# Patient Record
Sex: Female | Born: 1985 | Race: Black or African American | Hispanic: No | Marital: Single | Smoking: Former smoker
Health system: Southern US, Community
[De-identification: ages and names within clinical notes are randomized; demographics above are authoritative.]

## PROBLEM LIST (undated history)

## (undated) DIAGNOSIS — I10 Essential (primary) hypertension: Secondary | ICD-10-CM

## (undated) DIAGNOSIS — D219 Benign neoplasm of connective and other soft tissue, unspecified: Secondary | ICD-10-CM

## (undated) DIAGNOSIS — M549 Dorsalgia, unspecified: Secondary | ICD-10-CM

## (undated) HISTORY — DX: Essential (primary) hypertension: I10

## (undated) HISTORY — DX: Dorsalgia, unspecified: M54.9

## (undated) HISTORY — DX: Benign neoplasm of connective and other soft tissue, unspecified: D21.9

---

## 2008-06-28 ENCOUNTER — Emergency Department (HOSPITAL_COMMUNITY): Admission: EM | Admit: 2008-06-28 | Discharge: 2008-06-28 | Payer: Self-pay | Admitting: Emergency Medicine

## 2008-10-18 ENCOUNTER — Emergency Department (HOSPITAL_COMMUNITY): Admission: EM | Admit: 2008-10-18 | Discharge: 2008-10-18 | Payer: Self-pay | Admitting: Emergency Medicine

## 2009-05-23 ENCOUNTER — Emergency Department (HOSPITAL_COMMUNITY): Admission: EM | Admit: 2009-05-23 | Discharge: 2009-05-23 | Payer: Self-pay | Admitting: Emergency Medicine

## 2010-01-27 ENCOUNTER — Emergency Department (HOSPITAL_COMMUNITY): Admission: EM | Admit: 2010-01-27 | Discharge: 2010-01-27 | Payer: Self-pay | Admitting: Emergency Medicine

## 2010-06-29 LAB — WET PREP, GENITAL
Clue Cells Wet Prep HPF POC: NONE SEEN
Trich, Wet Prep: NONE SEEN
Yeast Wet Prep HPF POC: NONE SEEN

## 2010-06-29 LAB — URINALYSIS, ROUTINE W REFLEX MICROSCOPIC
Bilirubin Urine: NEGATIVE
Protein, ur: NEGATIVE mg/dL
Urobilinogen, UA: 1 mg/dL (ref 0.0–1.0)

## 2010-06-29 LAB — DIFFERENTIAL
Basophils Absolute: 0.1 10*3/uL (ref 0.0–0.1)
Basophils Relative: 2 % — ABNORMAL HIGH (ref 0–1)
Eosinophils Absolute: 0.1 10*3/uL (ref 0.0–0.7)
Eosinophils Relative: 2 % (ref 0–5)
Lymphocytes Relative: 27 % (ref 12–46)
Lymphs Abs: 1.8 10*3/uL (ref 0.7–4.0)
Monocytes Absolute: 0.7 10*3/uL (ref 0.1–1.0)
Monocytes Relative: 11 % (ref 3–12)
Neutro Abs: 4 10*3/uL (ref 1.7–7.7)
Neutrophils Relative %: 59 % (ref 43–77)

## 2010-06-29 LAB — GC/CHLAMYDIA PROBE AMP, GENITAL
Chlamydia, DNA Probe: NEGATIVE
GC Probe Amp, Genital: NEGATIVE

## 2010-06-29 LAB — URINE MICROSCOPIC-ADD ON

## 2010-06-29 LAB — POCT I-STAT, CHEM 8
Chloride: 105 mEq/L (ref 96–112)
Glucose, Bld: 105 mg/dL — ABNORMAL HIGH (ref 70–99)
HCT: 42 % (ref 36.0–46.0)
Potassium: 3.8 mEq/L (ref 3.5–5.1)
Sodium: 141 mEq/L (ref 135–145)

## 2010-06-29 LAB — CBC
Hemoglobin: 12.4 g/dL (ref 12.0–15.0)
MCHC: 32.8 g/dL (ref 30.0–36.0)
Platelets: 335 10*3/uL (ref 150–400)
RBC: 4.55 MIL/uL (ref 3.87–5.11)

## 2010-06-29 LAB — POCT PREGNANCY, URINE: Preg Test, Ur: NEGATIVE

## 2010-07-05 LAB — URINALYSIS, ROUTINE W REFLEX MICROSCOPIC
Bilirubin Urine: NEGATIVE
Glucose, UA: NEGATIVE mg/dL
Hgb urine dipstick: NEGATIVE
Ketones, ur: 15 mg/dL — AB
Protein, ur: NEGATIVE mg/dL
pH: 6 (ref 5.0–8.0)

## 2010-07-05 LAB — URINE MICROSCOPIC-ADD ON

## 2010-07-05 LAB — POCT PREGNANCY, URINE: Preg Test, Ur: NEGATIVE

## 2010-07-06 ENCOUNTER — Emergency Department (HOSPITAL_COMMUNITY): Payer: Self-pay

## 2010-07-06 ENCOUNTER — Emergency Department (HOSPITAL_COMMUNITY)
Admission: EM | Admit: 2010-07-06 | Discharge: 2010-07-06 | Disposition: A | Discharge status: Home / Self Care | Payer: Self-pay | Attending: Emergency Medicine | Admitting: Emergency Medicine

## 2010-07-06 DIAGNOSIS — R5381 Other malaise: Secondary | ICD-10-CM | POA: Insufficient documentation

## 2010-07-06 DIAGNOSIS — K089 Disorder of teeth and supporting structures, unspecified: Secondary | ICD-10-CM | POA: Insufficient documentation

## 2010-07-06 DIAGNOSIS — R5383 Other fatigue: Secondary | ICD-10-CM | POA: Insufficient documentation

## 2010-07-06 DIAGNOSIS — M542 Cervicalgia: Secondary | ICD-10-CM | POA: Insufficient documentation

## 2010-07-23 LAB — D-DIMER, QUANTITATIVE: D-Dimer, Quant: 0.49 ug/mL-FEU — ABNORMAL HIGH (ref 0.00–0.48)

## 2010-07-23 LAB — POCT I-STAT, CHEM 8
HCT: 44 % (ref 36.0–46.0)
Hemoglobin: 15 g/dL (ref 12.0–15.0)
Potassium: 3.7 mEq/L (ref 3.5–5.1)
Sodium: 139 mEq/L (ref 135–145)
TCO2: 24 mmol/L (ref 0–100)

## 2010-07-27 LAB — URINALYSIS, ROUTINE W REFLEX MICROSCOPIC
Glucose, UA: NEGATIVE mg/dL
Ketones, ur: NEGATIVE mg/dL
Protein, ur: NEGATIVE mg/dL
Urobilinogen, UA: 1 mg/dL (ref 0.0–1.0)

## 2010-07-27 LAB — WET PREP, GENITAL: Yeast Wet Prep HPF POC: NONE SEEN

## 2010-07-27 LAB — URINE MICROSCOPIC-ADD ON

## 2010-07-27 LAB — GC/CHLAMYDIA PROBE AMP, GENITAL: GC Probe Amp, Genital: NEGATIVE

## 2010-08-09 ENCOUNTER — Emergency Department (HOSPITAL_COMMUNITY)
Admission: EM | Admit: 2010-08-09 | Discharge: 2010-08-09 | Disposition: A | Discharge status: Home / Self Care | Payer: Self-pay | Attending: Emergency Medicine | Admitting: Emergency Medicine

## 2010-08-09 DIAGNOSIS — K089 Disorder of teeth and supporting structures, unspecified: Secondary | ICD-10-CM | POA: Insufficient documentation

## 2010-08-09 DIAGNOSIS — K047 Periapical abscess without sinus: Secondary | ICD-10-CM | POA: Insufficient documentation

## 2010-08-09 DIAGNOSIS — K029 Dental caries, unspecified: Secondary | ICD-10-CM | POA: Insufficient documentation

## 2011-01-02 ENCOUNTER — Emergency Department (HOSPITAL_COMMUNITY)
Admission: EM | Admit: 2011-01-02 | Discharge: 2011-01-02 | Disposition: A | Discharge status: Home / Self Care | Payer: Self-pay | Attending: Emergency Medicine | Admitting: Emergency Medicine

## 2011-01-02 DIAGNOSIS — N6459 Other signs and symptoms in breast: Secondary | ICD-10-CM | POA: Insufficient documentation

## 2011-01-02 DIAGNOSIS — J029 Acute pharyngitis, unspecified: Secondary | ICD-10-CM | POA: Insufficient documentation

## 2011-01-02 LAB — RAPID STREP SCREEN (MED CTR MEBANE ONLY): Streptococcus, Group A Screen (Direct): NEGATIVE

## 2011-01-05 ENCOUNTER — Emergency Department (HOSPITAL_COMMUNITY)
Admission: EM | Admit: 2011-01-05 | Discharge: 2011-01-05 | Disposition: A | Discharge status: Home / Self Care | Payer: Self-pay | Attending: Emergency Medicine | Admitting: Emergency Medicine

## 2011-01-05 ENCOUNTER — Emergency Department (HOSPITAL_COMMUNITY): Payer: Self-pay

## 2011-01-05 DIAGNOSIS — R059 Cough, unspecified: Secondary | ICD-10-CM | POA: Insufficient documentation

## 2011-01-05 DIAGNOSIS — B9789 Other viral agents as the cause of diseases classified elsewhere: Secondary | ICD-10-CM | POA: Insufficient documentation

## 2011-01-05 DIAGNOSIS — R5381 Other malaise: Secondary | ICD-10-CM | POA: Insufficient documentation

## 2011-01-05 DIAGNOSIS — R079 Chest pain, unspecified: Secondary | ICD-10-CM | POA: Insufficient documentation

## 2011-01-05 DIAGNOSIS — R5383 Other fatigue: Secondary | ICD-10-CM | POA: Insufficient documentation

## 2011-01-05 DIAGNOSIS — R05 Cough: Secondary | ICD-10-CM | POA: Insufficient documentation

## 2011-01-05 DIAGNOSIS — R091 Pleurisy: Secondary | ICD-10-CM | POA: Insufficient documentation

## 2011-01-05 DIAGNOSIS — N898 Other specified noninflammatory disorders of vagina: Secondary | ICD-10-CM | POA: Insufficient documentation

## 2011-01-05 LAB — WET PREP, GENITAL
Trich, Wet Prep: NONE SEEN
Yeast Wet Prep HPF POC: NONE SEEN

## 2011-01-31 ENCOUNTER — Encounter: Payer: Self-pay | Admitting: Family Medicine

## 2014-04-28 ENCOUNTER — Emergency Department: Payer: Self-pay | Admitting: Emergency Medicine

## 2014-04-28 LAB — COMPREHENSIVE METABOLIC PANEL
ALBUMIN: 3.7 g/dL (ref 3.4–5.0)
ANION GAP: 6 — AB (ref 7–16)
Alkaline Phosphatase: 84 U/L
BUN: 10 mg/dL (ref 7–18)
Bilirubin,Total: 0.3 mg/dL (ref 0.2–1.0)
Calcium, Total: 8.9 mg/dL (ref 8.5–10.1)
Chloride: 105 mmol/L (ref 98–107)
Co2: 27 mmol/L (ref 21–32)
Creatinine: 1.01 mg/dL (ref 0.60–1.30)
EGFR (African American): >60
EGFR (Non-African Amer.): >60
GLUCOSE: 88 mg/dL (ref 65–99)
OSMOLALITY: 274 (ref 275–301)
POTASSIUM: 3.4 mmol/L — AB (ref 3.5–5.1)
SGOT(AST): 18 U/L (ref 15–37)
SGPT (ALT): 24 U/L
SODIUM: 138 mmol/L (ref 136–145)
TOTAL PROTEIN: 8.1 g/dL (ref 6.4–8.2)

## 2014-04-28 LAB — CBC WITH DIFFERENTIAL/PLATELET
Basophil #: 0.1 10*3/uL (ref 0.0–0.1)
Basophil %: 0.6 %
EOS ABS: 0.1 10*3/uL (ref 0.0–0.7)
Eosinophil %: 1 %
HCT: 38.5 % (ref 35.0–47.0)
HGB: 12.4 g/dL (ref 12.0–16.0)
LYMPHS ABS: 2.6 10*3/uL (ref 1.0–3.6)
Lymphocyte %: 21.1 %
MCH: 26.4 pg (ref 26.0–34.0)
MCHC: 32.3 g/dL (ref 32.0–36.0)
MCV: 82 fL (ref 80–100)
MONO ABS: 1.4 x10 3/mm — AB (ref 0.2–0.9)
Monocyte %: 11.1 %
NEUTROS PCT: 66.2 %
Neutrophil #: 8 10*3/uL — ABNORMAL HIGH (ref 1.4–6.5)
PLATELETS: 336 10*3/uL (ref 150–440)
RBC: 4.72 10*6/uL (ref 3.80–5.20)
RDW: 14 % (ref 11.5–14.5)
WBC: 12.2 10*3/uL — ABNORMAL HIGH (ref 3.6–11.0)

## 2014-04-28 LAB — URINALYSIS, COMPLETE
BILIRUBIN, UR: NEGATIVE
Glucose,UR: NEGATIVE mg/dL (ref 0–75)
KETONE: NEGATIVE
Nitrite: POSITIVE
PH: 6 (ref 4.5–8.0)
PROTEIN: NEGATIVE
RBC,UR: 3 /HPF (ref 0–5)
SPECIFIC GRAVITY: 1.018 (ref 1.003–1.030)

## 2014-04-28 LAB — LIPASE, BLOOD: LIPASE: 89 U/L (ref 73–393)

## 2014-04-28 LAB — WET PREP, GENITAL

## 2014-04-29 LAB — GC/CHLAMYDIA PROBE AMP

## 2014-05-02 LAB — URINE CULTURE

## 2016-02-14 ENCOUNTER — Ambulatory Visit (INDEPENDENT_AMBULATORY_CARE_PROVIDER_SITE_OTHER): Payer: 59 | Admitting: Internal Medicine

## 2016-02-14 ENCOUNTER — Encounter: Payer: Self-pay | Admitting: Internal Medicine

## 2016-02-14 VITALS — BP 142/82 | HR 88 | Resp 16 | Ht 64.0 in | Wt 207.0 lb

## 2016-02-14 DIAGNOSIS — Z6835 Body mass index (BMI) 35.0-35.9, adult: Secondary | ICD-10-CM

## 2016-02-14 DIAGNOSIS — M62830 Muscle spasm of back: Secondary | ICD-10-CM | POA: Diagnosis not present

## 2016-02-14 DIAGNOSIS — N926 Irregular menstruation, unspecified: Secondary | ICD-10-CM | POA: Diagnosis not present

## 2016-02-14 DIAGNOSIS — I10 Essential (primary) hypertension: Secondary | ICD-10-CM | POA: Diagnosis not present

## 2016-02-14 MED ORDER — NAPROXEN 500 MG PO TABS: 500.0000 mg | ORAL_TABLET | Freq: Two times a day (BID) | ORAL | 0 refills | Status: DC

## 2016-02-14 MED ORDER — METHOCARBAMOL 500 MG PO TABS: 500.0000 mg | ORAL_TABLET | Freq: Three times a day (TID) | ORAL | 0 refills | Status: DC

## 2016-02-14 MED ORDER — IRBESARTAN 150 MG PO TABS: 150.0000 mg | ORAL_TABLET | Freq: Every day | ORAL | 5 refills | Status: DC

## 2016-02-14 NOTE — Progress Notes (Signed)
Date:  02/14/2016   Name:  Brandi Cole   DOB:  [birth year]   MRN:  [id number]   Chief Complaint: Establish Care and Menstrual Problem (Irregular and Heavy and dx with Fibroid by U/S 2015 [facility] ER never seen by OBGYN and followed [facility] Ion as a child but no PCP in years. ) Hypertension  This is a chronic problem. The current episode started more than 1 year ago. The problem is uncontrolled. Pertinent negatives include no chest pain, headaches, neck pain, palpitations or shortness of breath. There are no associated agents to hypertension. Past treatments include ACE inhibitors (cough on lisinopril and excessive urination of hct).  Back Pain  This is a recurrent problem. The problem has been waxing and waning since onset. The pain is present in the lumbar spine. The quality of the pain is described as aching and cramping. Pertinent negatives include no abdominal pain, chest pain, dysuria, fever, headaches, numbness, pelvic pain or weakness. She has tried muscle relaxant, analgesics, heat and home exercises for the symptoms. The treatment provided mild relief.   Uterine Fibroids - has had some irregular and heavy bleeding in the past seen by GYN.  She had an [place] and was placed on medication (Lysteda) for a year but stopped it in June when Rx ran out.  Overweight - needs BMI < 30.  She works for [employer] and has a form to be completed.  She has already started walking and cutting back on diet.  She thinks she has lost about 10-12 lbs.  Review of Systems  Constitutional: Negative for chills, fatigue and fever.  Respiratory: Negative for cough, chest tightness, shortness of breath and wheezing.   Cardiovascular: Negative for chest pain, palpitations and leg swelling.  Gastrointestinal: Negative for abdominal pain and blood in stool.  Genitourinary: Positive for menstrual problem. Negative for dysuria, genital sores and pelvic pain.  Musculoskeletal: Positive for back pain and myalgias. Negative  for gait problem, joint swelling, neck pain and neck stiffness.  Neurological: Negative for weakness, numbness and headaches.    Patient Active Problem List   Diagnosis Date Noted  . Irregular menses 02/14/2016    Prior to Admission medications   Medication Sig Start Date End Date Taking? Authorizing Provider  cyclobenzaprine (FLEXERIL) 10 MG tablet Take 10 mg by mouth 3 (three) times daily as needed for muscle spasms.   Yes Historical Provider, MD  lisinopril-hydrochlorothiazide (PRINZIDE,ZESTORETIC) 10-12.5 MG tablet Take by mouth.    Historical Provider, MD  tranexamic acid (LYSTEDA) 650 MG TABS tablet Take by mouth. 09/15/14   Historical Provider, MD    No Known Allergies  History reviewed. No pertinent surgical history.  Social History  Substance Use Topics  . Smoking status: Never Smoker  . Smokeless tobacco: Never Used  . Alcohol use Yes     Comment: weekend only mixed drinks      Medication list has been reviewed and updated.   Physical Exam  Constitutional: She is oriented to person, place, and time. She appears well-developed. No distress.  HENT:  Head: Normocephalic and atraumatic.  Cardiovascular: Normal rate, regular rhythm and normal heart sounds.   Pulmonary/Chest: Effort normal and breath sounds normal. No respiratory distress.  Musculoskeletal: Normal range of motion.       Lumbar back: She exhibits pain and spasm. She exhibits no bony tenderness and no deformity.       Arms: Severe spasm of muscles in the left para- spinous region lower thoracic and upper lumbar  Neurological: She is alert and oriented to person, place, and time. She has normal strength.  Reflex Scores:      Patellar reflexes are 2+ on the right side and 2+ on the left side. Skin: Skin is warm and dry. No rash noted.  Psychiatric: She has a normal mood and affect. Her behavior is normal. Thought content normal.  Nursing note and vitals reviewed.   BP (!) 142/82   Pulse 88   Resp 16    Ht 5\' 4"  (1.626 m)   Wt 207 lb (93.9 kg)   LMP 02/07/2016 (Approximate) Comment: Very heavy fibroids   SpO2 99%   BMI 35.53 kg/m   Assessment and Plan: 1. Irregular menses Somewhat stablized without medication at this time Follow up with GYN if needed  2. Muscle spasm of back Stop flexeril due to sedation; try robaxin for consistent dosing Continue heat Consider Chiropractic care after 1-2 weeks on the following regimen - methocarbamol (ROBAXIN) 500 MG tablet; Take 1 tablet (500 mg total) by mouth 3 (three) times daily.  Dispense: 90 tablet; Refill: 0 - naproxen (NAPROSYN) 500 MG tablet; Take 1 tablet (500 mg total) by mouth 2 (two) times daily with a meal.  Dispense: 60 tablet; Refill: 0  3. Essential hypertension Not controlled - - irbesartan (AVAPRO) 150 MG tablet; Take 1 tablet (150 mg total) by mouth daily.  Dispense: 30 tablet; Refill: 5  4. BMI 35.0-35.9,adult Note for [facility] given Continue exercise and dietary changes - goal weight loss 20 lbs over the next 6 months   Halina Maidens, MD [facility]  02/14/2016

## 2016-02-14 NOTE — Patient Instructions (Signed)
DASH Eating Plan  DASH stands for "Dietary Approaches to Stop Hypertension." The DASH eating plan is a healthy eating plan that has been shown to reduce high blood pressure (hypertension). Additional health benefits may include reducing the risk of type 2 diabetes mellitus, heart disease, and stroke. The DASH eating plan may also help with weight loss.  WHAT DO I NEED TO KNOW ABOUT THE DASH EATING PLAN?  For the DASH eating plan, you will follow these general guidelines:  · Choose foods with a percent daily value for sodium of less than 5% (as listed on the food label).  · Use salt-free seasonings or herbs instead of table salt or sea salt.  · Check with your health care provider or pharmacist before using salt substitutes.  · Eat lower-sodium products, often labeled as "lower sodium" or "no salt added."  · Eat fresh foods.  · Eat more vegetables, fruits, and low-fat dairy products.  · Choose whole grains. Look for the word "whole" as the first word in the ingredient list.  · Choose fish and skinless chicken or turkey more often than red meat. Limit fish, poultry, and meat to 6 oz (170 g) each day.  · Limit sweets, desserts, sugars, and sugary drinks.  · Choose heart-healthy fats.  · Limit cheese to 1 oz (28 g) per day.  · Eat more home-cooked food and less restaurant, buffet, and fast food.  · Limit fried foods.  · Cook foods using methods other than frying.  · Limit canned vegetables. If you do use them, rinse them well to decrease the sodium.  · When eating at a restaurant, ask that your food be prepared with less salt, or no salt if possible.  WHAT FOODS CAN I EAT?  Seek help from a dietitian for individual calorie needs.  Grains  Whole grain or whole wheat bread. Brown rice. Whole grain or whole wheat pasta. Quinoa, bulgur, and whole grain cereals. Low-sodium cereals. Corn or whole wheat flour tortillas. Whole grain cornbread. Whole grain crackers. Low-sodium crackers.  Vegetables  Fresh or frozen vegetables  (raw, steamed, roasted, or grilled). Low-sodium or reduced-sodium tomato and vegetable juices. Low-sodium or reduced-sodium tomato sauce and paste. Low-sodium or reduced-sodium canned vegetables.   Fruits  All fresh, canned (in natural juice), or frozen fruits.  Meat and Other Protein Products  Ground beef (85% or leaner), grass-fed beef, or beef trimmed of fat. Skinless chicken or turkey. Ground chicken or turkey. Pork trimmed of fat. All fish and seafood. Eggs. Dried beans, peas, or lentils. Unsalted nuts and seeds. Unsalted canned beans.  Dairy  Low-fat dairy products, such as skim or 1% milk, 2% or reduced-fat cheeses, low-fat ricotta or cottage cheese, or plain low-fat yogurt. Low-sodium or reduced-sodium cheeses.  Fats and Oils  Tub margarines without trans fats. Light or reduced-fat mayonnaise and salad dressings (reduced sodium). Avocado. Safflower, olive, or canola oils. Natural peanut or almond butter.  Other  Unsalted popcorn and pretzels.  The items listed above may not be a complete list of recommended foods or beverages. Contact your dietitian for more options.  WHAT FOODS ARE NOT RECOMMENDED?  Grains  White bread. White pasta. White rice. Refined cornbread. Bagels and croissants. Crackers that contain trans fat.  Vegetables  Creamed or fried vegetables. Vegetables in a cheese sauce. Regular canned vegetables. Regular canned tomato sauce and paste. Regular tomato and vegetable juices.  Fruits  Dried fruits. Canned fruit in light or heavy syrup. Fruit juice.  Meat and Other Protein   Products  Fatty cuts of meat. Ribs, chicken wings, bacon, sausage, bologna, salami, chitterlings, fatback, hot dogs, bratwurst, and packaged luncheon meats. Salted nuts and seeds. Canned beans with salt.  Dairy  Whole or 2% milk, cream, half-and-half, and cream cheese. Whole-fat or sweetened yogurt. Full-fat cheeses or blue cheese. Nondairy creamers and whipped toppings. Processed cheese, cheese spreads, or cheese  curds.  Condiments  Onion and garlic salt, seasoned salt, table salt, and sea salt. Canned and packaged gravies. Worcestershire sauce. Tartar sauce. Barbecue sauce. Teriyaki sauce. Soy sauce, including reduced sodium. Steak sauce. Fish sauce. Oyster sauce. Cocktail sauce. Horseradish. Ketchup and mustard. Meat flavorings and tenderizers. Bouillon cubes. Hot sauce. Tabasco sauce. Marinades. Taco seasonings. Relishes.  Fats and Oils  Butter, stick margarine, lard, shortening, ghee, and bacon fat. Coconut, palm kernel, or palm oils. Regular salad dressings.  Other  Pickles and olives. Salted popcorn and pretzels.  The items listed above may not be a complete list of foods and beverages to avoid. Contact your dietitian for more information.  WHERE CAN I FIND MORE INFORMATION?  National Heart, Lung, and Blood Institute: www.nhlbi.nih.gov/health/health-topics/topics/dash/     This information is not intended to replace advice given to you by your health care provider. Make sure you discuss any questions you have with your health care provider.     Document Released: 03/22/2011 Document Revised: 04/23/2014 Document Reviewed: 02/04/2013  Elsevier Interactive Patient Education ©2016 Elsevier Inc.

## 2016-04-20 ENCOUNTER — Ambulatory Visit: Payer: 59 | Admitting: Internal Medicine

## 2016-04-26 ENCOUNTER — Ambulatory Visit: Payer: 59 | Admitting: Internal Medicine

## 2016-12-28 ENCOUNTER — Ambulatory Visit: Payer: 59 | Admitting: Internal Medicine

## 2017-01-06 ENCOUNTER — Encounter: Payer: Self-pay | Admitting: Emergency Medicine

## 2017-01-06 ENCOUNTER — Emergency Department
Admission: EM | Admit: 2017-01-06 | Discharge: 2017-01-06 | Disposition: A | Discharge status: Home / Self Care | Payer: 59 | Attending: Emergency Medicine | Admitting: Emergency Medicine

## 2017-01-06 DIAGNOSIS — Z791 Long term (current) use of non-steroidal anti-inflammatories (NSAID): Secondary | ICD-10-CM | POA: Insufficient documentation

## 2017-01-06 DIAGNOSIS — K130 Diseases of lips: Secondary | ICD-10-CM

## 2017-01-06 DIAGNOSIS — R220 Localized swelling, mass and lump, head: Secondary | ICD-10-CM | POA: Insufficient documentation

## 2017-01-06 DIAGNOSIS — Z79899 Other long term (current) drug therapy: Secondary | ICD-10-CM | POA: Insufficient documentation

## 2017-01-06 DIAGNOSIS — I10 Essential (primary) hypertension: Secondary | ICD-10-CM | POA: Insufficient documentation

## 2017-01-06 DIAGNOSIS — Z8619 Personal history of other infectious and parasitic diseases: Secondary | ICD-10-CM | POA: Insufficient documentation

## 2017-01-06 DIAGNOSIS — R600 Localized edema: Secondary | ICD-10-CM | POA: Diagnosis present

## 2017-01-06 MED ORDER — AMOXICILLIN 500 MG PO CAPS: 500.0000 mg | ORAL_CAPSULE | Freq: Three times a day (TID) | ORAL | 0 refills | Status: DC

## 2017-01-06 MED ORDER — METHYLPREDNISOLONE SODIUM SUCC 125 MG IJ SOLR
125.0000 mg | Freq: Once | INTRAMUSCULAR | Status: AC
  Administered 2017-01-06: 125 mg via INTRAMUSCULAR
  Filled 2017-01-06: qty 2

## 2017-01-06 MED ORDER — ACYCLOVIR 200 MG PO CAPS: 200.0000 mg | ORAL_CAPSULE | Freq: Every day | ORAL | 0 refills | Status: AC

## 2017-01-06 MED ORDER — DIPHENHYDRAMINE HCL 25 MG PO CAPS: 25.0000 mg | ORAL_CAPSULE | ORAL | 0 refills | Status: DC | PRN

## 2017-01-06 NOTE — ED Notes (Signed)
See triage note  Presents with swelling to upper lip  States she noticed swelling to upper yesterday  Area became worse this am with some swelling to left side of face

## 2017-01-06 NOTE — ED Provider Notes (Signed)
[facility] Emergency Department Provider Note  ____________________________________________  Time seen: Approximately 2:10 PM  I have reviewed the triage vital signs and the nursing notes.   HISTORY  Chief Complaint Oral Swelling    HPI Brandi Cole is a 31 y.o. female that presents to the emergency department for evaluation of upper lip swelling for 1 day. Patient is primarily concerned that she is having an allergic reaction. She ate mushrooms yesterday, which she has not had a long time and things that could be it. She has a sore on her lip that she states is just from dry cracked lips. Sore is painful. She is concerned that maybe it is now infected. She has had cold sores before but they have not looked like this. She just returned from [place]. No fever, throat swelling, shortness of breath, chest pain, nausea, vomiting, abdominal pain.   Past Medical History:  Diagnosis Date  . Back pain   . Fibroids   . Hypertension     Patient Active Problem List   Diagnosis Date Noted  . Irregular menses 02/14/2016    History reviewed. No pertinent surgical history.  Prior to Admission medications   Medication Sig Start Date End Date Taking? Authorizing Provider  acyclovir (ZOVIRAX) 200 MG capsule Take 1 capsule (200 mg total) by mouth 5 (five) times daily. 01/06/17 01/16/17  Laban Emperor, PA-C  amoxicillin (AMOXIL) 500 MG capsule Take 1 capsule (500 mg total) by mouth 3 (three) times daily. 01/06/17   Laban Emperor, PA-C  diphenhydrAMINE (BENADRYL) 25 mg capsule Take 1 capsule (25 mg total) by mouth every 4 (four) hours as needed. 01/06/17 01/06/18  Laban Emperor, PA-C  irbesartan (AVAPRO) 150 MG tablet Take 1 tablet (150 mg total) by mouth daily. 02/14/16   Glean Hess, MD  methocarbamol (ROBAXIN) 500 MG tablet Take 1 tablet (500 mg total) by mouth 3 (three) times daily. 02/14/16   Glean Hess, MD  naproxen (NAPROSYN) 500 MG tablet Take 1 tablet  (500 mg total) by mouth 2 (two) times daily with a meal. 02/14/16   Glean Hess, MD    Allergies Ace inhibitors  Family History  Problem Relation Age of Onset  . Family history unknown: Yes    Social History Social History  Substance Use Topics  . Smoking status: Never Smoker  . Smokeless tobacco: Never Used  . Alcohol use Yes     Comment: weekend only mixed drinks      Review of Systems  Constitutional: No fever/chills Cardiovascular: No chest pain. Respiratory: No SOB. Gastrointestinal: No abdominal pain.  No nausea, no vomiting.  Musculoskeletal: Negative for musculoskeletal pain. Skin: Negative for lacerations, ecchymosis. Neurological: Negative for headaches   ____________________________________________   PHYSICAL EXAM:  VITAL SIGNS: ED Triage Vitals  Enc Vitals Group     BP 01/06/17 1257 (!) 164/100     Pulse Rate 01/06/17 1257 91     Resp 01/06/17 1257 18     Temp 01/06/17 1257 99.3 F (37.4 C)     Temp Source 01/06/17 1257 Oral     SpO2 01/06/17 1257 100 %     Weight 01/06/17 1258 216 lb (98 kg)     Height 01/06/17 1258 5\' 4"  (1.626 m)     Head Circumference --      Peak Flow --      Pain Score 01/06/17 1300 5     Pain Loc --      Pain Edu? --  Excl. in [place]? --      Constitutional: Alert and oriented. Well appearing and in no acute distress. Eyes: Conjunctivae are normal. PERRL. EOMI. Head: Atraumatic. ENT:      Ears:      Nose: No congestion/rhinnorhea.      Mouth/Throat: Mucous membranes are moist. Cluster of blisters with overlying yellow crusting and surrounding swelling to top left lip. Neck: No stridor.   Cardiovascular: Normal rate, regular rhythm.  Good peripheral circulation. Respiratory: Normal respiratory effort without tachypnea or retractions. Lungs CTAB. Good air entry to the bases with no decreased or absent breath sounds. Musculoskeletal: Full range of motion to all extremities. No gross deformities  appreciated. Neurologic:  Normal speech and language. No gross focal neurologic deficits are appreciated.  Skin:  Skin is warm, dry and intact. No rash noted.   ____________________________________________   LABS (all labs ordered are listed, but only abnormal results are displayed)  Labs Reviewed - No data to display ____________________________________________  EKG   ____________________________________________  RADIOLOGY  No results found.  ____________________________________________    PROCEDURES  Procedure(s) performed:    Procedures    Medications  methylPREDNISolone sodium succinate (SOLU-MEDROL) 125 mg/2 mL injection 125 mg (125 mg Intramuscular Given 01/06/17 1425)     ____________________________________________   INITIAL IMPRESSION / ASSESSMENT AND PLAN / ED COURSE  Pertinent labs & imaging results that were available during my care of the patient were reviewed by me and considered in my medical decision making (see chart for details).  Review of the [place] CSRS was performed in accordance of the [place] prior to dispensing any controlled drugs.   She presented to the emergency department for evaluation of upper lip swelling. Symptoms are consistent with a cold sore. Patient disagrees and is more concerned for allergic reaction or infection. I educated patient about cold sores and that she is at risk for transmitting. I agreed to also cover her for an allergic reaction or infection. Patient will be discharged home with prescriptions for Amoxicillin and Acyclovir. Patient is to follow up with PCP as directed. Patient is given ED precautions to return to the ED for any worsening or new symptoms.     ____________________________________________  FINAL CLINICAL IMPRESSION(S) / ED DIAGNOSES  Final diagnoses:  Swelling of upper lip  H/O cold sores  Sore of lip      NEW MEDICATIONS STARTED DURING THIS VISIT:  Discharge Medication List as of 01/06/2017   2:39 PM    START taking these medications   Details  acyclovir (ZOVIRAX) 200 MG capsule Take 1 capsule (200 mg total) by mouth 5 (five) times daily., Starting Sun 01/06/2017, Until Wed 01/16/2017, Print    amoxicillin (AMOXIL) 500 MG capsule Take 1 capsule (500 mg total) by mouth 3 (three) times daily., Starting Sun 01/06/2017, Print    diphenhydrAMINE (BENADRYL) 25 mg capsule Take 1 capsule (25 mg total) by mouth every 4 (four) hours as needed., Starting Sun 01/06/2017, Until Mon 01/06/2018, Print            This chart was dictated using voice recognition software/Dragon. Despite best efforts to proofread, errors can occur which can change the meaning. Any change was purely unintentional.    Laban Emperor, PA-C 01/06/17 1548    Burlene Arnt Gerda Diss, MD 01/07/17 2154

## 2017-01-06 NOTE — ED Triage Notes (Signed)
Small swollen area to upper lip. Started yesterday. Pt unsure if from mushrooms. Looks more like wound than allergic reaction. No fever. No dyspnea or dysphagia noted.

## 2017-01-11 ENCOUNTER — Ambulatory Visit
Admission: RE | Admit: 2017-01-11 | Discharge: 2017-01-11 | Disposition: A | Discharge status: Home / Self Care | Payer: 59 | Source: Ambulatory Visit | Attending: Internal Medicine | Admitting: Internal Medicine

## 2017-01-11 ENCOUNTER — Encounter: Payer: Self-pay | Admitting: Internal Medicine

## 2017-01-11 ENCOUNTER — Ambulatory Visit (INDEPENDENT_AMBULATORY_CARE_PROVIDER_SITE_OTHER): Payer: 59 | Admitting: Internal Medicine

## 2017-01-11 VITALS — BP 134/86 | HR 87 | Ht 64.0 in | Wt 218.0 lb

## 2017-01-11 DIAGNOSIS — Z6837 Body mass index (BMI) 37.0-37.9, adult: Secondary | ICD-10-CM | POA: Diagnosis not present

## 2017-01-11 DIAGNOSIS — M62830 Muscle spasm of back: Secondary | ICD-10-CM

## 2017-01-11 DIAGNOSIS — N926 Irregular menstruation, unspecified: Secondary | ICD-10-CM

## 2017-01-11 DIAGNOSIS — I10 Essential (primary) hypertension: Secondary | ICD-10-CM

## 2017-01-11 MED ORDER — METHOCARBAMOL 500 MG PO TABS: 500.0000 mg | ORAL_TABLET | Freq: Three times a day (TID) | ORAL | 2 refills | Status: DC

## 2017-01-11 MED ORDER — NAPROXEN 500 MG PO TABS: 500.0000 mg | ORAL_TABLET | Freq: Two times a day (BID) | ORAL | 2 refills | Status: DC

## 2017-01-11 MED ORDER — IRBESARTAN 150 MG PO TABS: 150.0000 mg | ORAL_TABLET | Freq: Every day | ORAL | 5 refills | Status: DC

## 2017-01-11 NOTE — Progress Notes (Signed)
Pt informed. Patient would like referral for PT since XRAY normal.

## 2017-01-11 NOTE — Progress Notes (Signed)
Date:  01/11/2017   Name:  Brandi Cole   DOB:  [birth year]   MRN:  [id number]   Chief Complaint: BMI (Form for work needs to be discuss and faxed in.); Back Pain (Pulled muscle in back in 2011 and then within the last year. Taking methocarbamol but no longer helping. 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 ); and Fibroids (Found out had fibroids in 2016. Wants to discuss meds for periods being heavier. )  Back Pain  This is a recurrent problem. The problem has been waxing and waning since onset. The pain is present in the thoracic spine. The pain is mild. The symptoms are aggravated by bending, standing and twisting. Associated symptoms include pelvic pain. Pertinent negatives include no abdominal pain, chest pain or fever. She has tried muscle relaxant for the symptoms. The treatment provided no relief.   Menstrual problems - heavy bleeding with cramping and clots lasting 5-7 days.  Seen by GYN and placed on medications in the past.  Obesity - works for [facility] and needs BMI exception. She wants to lose about 30 lbs.  Her plan is to go back to the gym and work harder.  She is changing her diet and cooking more at home.  Review of Systems  Constitutional: Negative for chills, fatigue and fever.  Respiratory: Negative for chest tightness, shortness of breath and wheezing.   Cardiovascular: Negative for chest pain, palpitations and leg swelling.  Gastrointestinal: Negative for abdominal pain.  Genitourinary: Positive for menstrual problem and pelvic pain.  Musculoskeletal: Positive for back pain.    Patient Active Problem List   Diagnosis Date Noted  . Irregular menses 02/14/2016    Prior to Admission medications   Medication Sig Start Date End Date Taking? Authorizing Provider  acyclovir (ZOVIRAX) 200 MG capsule Take 1 capsule (200 mg total) by mouth 5 (five) times daily. 01/06/17 01/16/17 Yes Laban Emperor, PA-C  amoxicillin (AMOXIL) 500  MG capsule Take 1 capsule (500 mg total) by mouth 3 (three) times daily. 01/06/17  Yes Laban Emperor, PA-C  irbesartan (AVAPRO) 150 MG tablet Take 1 tablet (150 mg total) by mouth daily. 02/14/16  Yes Glean Hess, MD  methocarbamol (ROBAXIN) 500 MG tablet Take 1 tablet (500 mg total) by mouth 3 (three) times daily. 02/14/16  Yes Glean Hess, MD  naproxen (NAPROSYN) 500 MG tablet Take 1 tablet (500 mg total) by mouth 2 (two) times daily with a meal. 02/14/16  Yes Glean Hess, MD    Allergies  Allergen Reactions  . Ace Inhibitors Cough    History reviewed. No pertinent surgical history.  Social History  Substance Use Topics  . Smoking status: Former Smoker    Quit date: 09/15/2015  . Smokeless tobacco: Never Used  . Alcohol use Yes     Comment: weekend only mixed drinks      Medication list has been reviewed and updated.  PHQ 2/9 Scores 01/11/2017  PHQ - 2 Score 0    Physical Exam  Constitutional: She is oriented to person, place, and time. She appears well-developed. No distress.  HENT:  Head: Normocephalic and atraumatic.  Neck: Normal range of motion. Neck supple.  Cardiovascular: Normal rate, regular rhythm and normal heart sounds.   Pulmonary/Chest: Effort normal and breath sounds normal. No respiratory distress. She has no wheezes.  Musculoskeletal: She exhibits no edema.       Thoracic back: She exhibits tenderness and  spasm.  Neurological: She is alert and oriented to person, place, and time.  Skin: Skin is warm and dry. No rash noted.  Psychiatric: She has a normal mood and affect. Her speech is normal and behavior is normal. Thought content normal.  Nursing note and vitals reviewed.   BP 134/86 (BP Location: Left Arm, Patient Position: Sitting, Cuff Size: Large)   Pulse 87   Ht 5\' 4"  (1.626 m)   Wt 218 lb (98.9 kg)   LMP 01/09/2017 (Exact Date)   SpO2 99%   BMI 37.42 kg/m   Assessment and Plan: 1. Irregular menses Recommend follow up with  GYN  2. Muscle spasm of back xrays to rule out mild scolosis May need to refer to PT - DG Thoracic Spine 2 View; Future - methocarbamol (ROBAXIN) 500 MG tablet; Take 1 tablet (500 mg total) by mouth 3 (three) times daily.  Dispense: 90 tablet; Refill: 2 - naproxen (NAPROSYN) 500 MG tablet; Take 1 tablet (500 mg total) by mouth 2 (two) times daily with a meal.  Dispense: 60 tablet; Refill: 2  3. BMI 37.0-37.9, adult Form exemption completed  4. Essential hypertension controlled - irbesartan (AVAPRO) 150 MG tablet; Take 1 tablet (150 mg total) by mouth daily.  Dispense: 30 tablet; Refill: 5   Meds ordered this encounter  Medications  . methocarbamol (ROBAXIN) 500 MG tablet    Sig: Take 1 tablet (500 mg total) by mouth 3 (three) times daily.    Dispense:  90 tablet    Refill:  2  . irbesartan (AVAPRO) 150 MG tablet    Sig: Take 1 tablet (150 mg total) by mouth daily.    Dispense:  30 tablet    Refill:  5  . naproxen (NAPROSYN) 500 MG tablet    Sig: Take 1 tablet (500 mg total) by mouth 2 (two) times daily with a meal.    Dispense:  60 tablet    Refill:  2    Partially dictated using Editor, commissioning. Any errors are unintentional.  Halina Maidens, MD [facility]  01/11/2017

## 2017-01-14 ENCOUNTER — Other Ambulatory Visit: Payer: Self-pay | Admitting: Internal Medicine

## 2017-01-14 DIAGNOSIS — M62830 Muscle spasm of back: Secondary | ICD-10-CM

## 2017-02-08 ENCOUNTER — Ambulatory Visit: Payer: 59 | Attending: Internal Medicine

## 2017-02-08 DIAGNOSIS — M545 Low back pain, unspecified: Secondary | ICD-10-CM

## 2017-02-08 DIAGNOSIS — G8929 Other chronic pain: Secondary | ICD-10-CM | POA: Diagnosis present

## 2017-02-08 DIAGNOSIS — M546 Pain in thoracic spine: Secondary | ICD-10-CM | POA: Diagnosis present

## 2017-02-08 NOTE — Therapy (Signed)
[facility] [address] [place], [address] Phone: [phone]   Fax:  [phone]  Physical Therapy Evaluation  Patient Details  Name: Brandi Cole MRN: [id number] Date of Birth: [birth year] Referring Provider: Halina Maidens  Encounter Date: 02/08/2017      PT End of Session - 02/08/17 1153    Visit Number 1   Number of Visits 17   Date for PT Re-Evaluation 04/05/17   Authorization Type no g codes   PT Start Time 0820   PT Stop Time 0915   PT Time Calculation (min) 55 min   Activity Tolerance Patient tolerated treatment well   Behavior During Therapy [facility] for tasks assessed/performed      Past Medical History:  Diagnosis Date  . Back pain   . Fibroids   . Hypertension     No past surgical history on file.  There were no vitals filed for this visit.       Subjective Assessment - 02/08/17 0837    Subjective Mid and low back pain   Pertinent History Pt referred for physical therapy for low and mid back pain. She describes low back pain as sharp and mid back pain as throbbing. 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 She reports that she "pulled a muscle" in her back in 08/11/2009 when she was reaching down for something and when she stood up she felt like her lower back "shattered." Pt reports that she waited for 3 days and when she went to the ER she states that the muscle had swollen significantly. She was prescribed a steroid, tramadol, and administered morphine. She was told that she "strained the muscle." Pain improved but never completely resolved. She states that she had a repeat injury in 08/12/2014. When she saw Aida Puffer she was prescribed methocarbamol and naproxen. Naproxen doesn't help with her pain. She feels like the methocarbamol isn't helping as much anymore. She feels like she is taking too much of it  when her pain is exacerbated even though she is only taking it as prescribed. Pt has a history of fibroids with associated pain in RLQ which does radiate to her back. She does notice that her back and abdominal pain coincide at times but but she does frequently have back pain without abdominal pain. She has an upcoming appointment with her OBGYN. Pt denies radiating pain into hip or LE's. Denies N/T. Back pain occasionally wakes her up at night. Denies fevers, chills, night sweats, unexplained weight gain/loss, no personal history of cancer. No history of pregnancy. She reports that when her back hurts bad she feels winded and will have some R or L sided chest pain. Pt reports no associated LUE symptoms or neck pain. No prior history of cardiac problems.        Limitations Sitting   How long can you sit comfortably? 1.5 hours   How long can you stand comfortably? 30-40 minutes   How long can you walk comfortably? No limitation   Diagnostic tests Plain film radiographs: per pt "all normal"   Patient Stated Goals "sit for 2-4 hours without having to change position"   Currently in Pain? Yes   Pain Score 4   Best: 4/10, Worst: 10/10   Pain Location Back   Pain Orientation Right;Left;Mid;Lower   Pain Descriptors / Indicators Throbbing;Sharp   Pain Type Chronic pain  Pain Radiating Towards None   Pain Onset More than a month ago   Pain Frequency Intermittent   Aggravating Factors  extended sitting, bending, standing and twisting (especially while holding her niece in her arms),   Pain Relieving Factors walking, stretching, methocarbamol, heat, massage, position of comfort (slouching),   Multiple Pain Sites No               [facility] PT Assessment - 02/08/17 0842      Assessment   Medical Diagnosis Muscle spasm of back   Referring Provider Halina Maidens   Onset Date/Surgical Date 02/08/10  Approximate   Hand Dominance Right   Next MD Visit None scheduled. F/U as needed   Prior Therapy  None     Precautions   Precautions None     Restrictions   Weight Bearing Restrictions No     Balance Screen   Has the patient fallen in the past 6 months No   Has the patient had a decrease in activity level because of a fear of falling?  Yes   Is the patient reluctant to leave their home because of a fear of falling?  No     Home Ec[employer] residence     Prior Function   Level of Independence Independent     Cognition   Overall Cognitive Status Within Functional Limits for tasks assessed     Observation/Other Assessments   Other Surveys  Other Surveys   Modified [place] 26%   Fear Avoidance Belief Questionnaire (FABQ)  PA: 22/30, Work: 20/66, Total: 44/96     Sensation   Additional Comments Denies N/T in bilateral LE     Posture/Postural Control   Posture Comments No gross abnormalities in sitting posture noted     ROM / Strength   AROM / PROM / Strength AROM;Strength     AROM   Overall AROM Comments guarded and painful lumbar AROM in all planes. L lumbar pain with R lateral flexion, R lumbar pain with L lateral flexion. HS length is 70 degrees bilaterally with reproduction of low back pain in addition to hamstring stretch. Painful CPA throughout thoracic and lumbar spine with considerable guarding noted. Unable to assess segmental mobility secondary to pain. Negative Scour, FADIR, FABER bilaterally. Positive L slump. Hip IR/ER PROM grossly symmetrical and within acceptable limits. Low back pain reproduced with single knee to chest stretch.      Strength   Overall Strength Comments 5/5 throughout bilateral UE/LE without focal weakness.      Palpation   Palpation comment pain to palpation along bilateral lumbar paraspinals, bilateral periscapular musculature. Generalized pattern of pain without specific focal pain.            Objective measurements completed on examination: See above findings.                  PT  Education - 02/08/17 1152    Education provided Yes   Education Details HEP, plan of care   Person(s) Educated Patient   Methods Explanation;Demonstration;Handout   Comprehension Verbalized understanding          PT Short Term Goals - 02/08/17 1218      PT SHORT TERM GOAL #1   Title Pt will be independent with HEP in order to improve strength and decrease pain to improve pain-free function at home and work.    Time 4   Period Weeks   Status New   Target Date 03/08/17  PT Long Term Goals - 02/08/17 1219      PT LONG TERM GOAL #1   Title Pt will decrease mODI scoreby at least 13 points in order demonstrate clinically significant reduction in pain/disability    Baseline 02/08/17: 26%   Time 8   Period Days   Status New   Target Date 04/05/17     PT LONG TERM GOAL #2   Title Pt will be able to sit for at least 3 hours prior to needing to stand and move in order to better perform her job tasks   Baseline 02/08/17: 1.5 hours   Time 8   Period Weeks   Status New   Target Date 04/05/17     PT LONG TERM GOAL #3   Title Pt will decrease worst pain as reported on NPRS by at least 2 points in order to demonstrate clinically significant reduction in pain.    Baseline 02/08/17: worst: 10/10   Time 8   Period Weeks   Status New   Target Date 04/05/17                Plan - 02/08/17 1153    Clinical Impression Statement Pt is a pleasant 31 yo female referred to physical therapy for muscle spasm of back. Pt reports history of chronic mid and low back pain since she "pulled a muscle" in her back in 2011 when she was reaching down to pick something up. She feels like the methocarbamol isn't helping as much anymore with her pain and doesn't want to take it long term. Pt does have a history of fibroids with associated pain in RLQ of abdomen which does radiate to her back. She does notice that her back and abdominal pain coincide at times but she does frequently have  back pain without abdominal pain. Encouraged pt to follow up with OB-GYN regarding her fibroids. Otherwise ROS is negative for red flags. PT examination reveals normal strength throughout all extremities without weakness. Pt denies numbness/tingling or radicular-type pain. She is very painful to palpation along bilateral lumbar paraspinals and bilateral periscapular musculature. Generalized pattern of pain without specific focal pain noted. She has guarded and painful lumbar AROM and overpressure in all planes. HS length is 70 degrees bilaterally with reproduction of low back pain in addition to hamstring stretch. Painful CPA throughout thoracic and lumbar spine with considerable guarding noted. Unable to assess segmental mobility secondary to pain. Negative Scour, FADIR, FABER bilaterally. Hips do not appears be contributing to he back pain. Positive L slump but not reliable in setting of patient's highly irritable pain. Her modified ODI score is 26%. 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 She also reports that pain can be a stressor in addition to other stresses in her life. PHQ-9 is negative for depression screening but does indicate some sleeping difficulty and low energy. She reports history of anxiety but states she has not discussed this with Dr. Army Melia. She is fearful of movement with a fear avoidance score for physical activity of 22/30. Education today provided benefit of initial modalities for pain control with progression to graded exercise program to lower her pain. Stress reduction techniques would also be helpful in reducing her pain along with chronic pain education. She will benefit from skilled PT services to address deficits in back pain in order to improve pain-free function with work and  leisure activities.   History and Personal Factors relevant to plan of care: 2 personal  factors/comorbidities, 3 body systems/activity limitations/participation restrictions     Clinical Presentation Unstable   Clinical Presentation due to: highly variable pain, not improving since onset and intermittent in nature   Clinical Decision Making Moderate   Rehab Potential Good   PT Frequency 2x / week   PT Duration 8 weeks   PT Treatment/Interventions Aquatic Therapy;Cryotherapy;Electrical Stimulation;Iontophoresis 4mg /ml Dexamethasone;Moist Heat;Traction;Ultrasound;Gait training;Stair training;Functional mobility training;Therapeutic activities;Therapeutic exercise;Balance training;Neuromuscular re-education;Patient/family education;Manual techniques;Passive range of motion;Dry needling;Energy conservation   PT Next Visit Plan modalities as needed to interrupt pain cycle, progression to graded execise program   PT Home Exercise Plan Hooklying pelvic tilts, hooklying gentle lumbar AROM rotation, single knee to chest stretch      Patient will benefit from skilled therapeutic intervention in order to improve the following deficits and impairments:  Obesity, Pain  Visit Diagnosis: Chronic bilateral low back pain without sciatica - Plan: PT plan of care cert/re-cert  Pain in thoracic spine - Plan: PT plan of care cert/re-cert     Problem List Patient Active Problem List   Diagnosis Date Noted  . Irregular menses 02/14/2016   Phillips Grout PT, DPT   Sharmon Cheramie 02/08/2017, 12:24 PM  [facility] [address] [place], [address] Phone: [phone]   Fax:  [phone]  Name: Ramata Strothman MRN: [id number] Date of Birth: 29-Jul-1985

## 2017-02-13 ENCOUNTER — Ambulatory Visit: Payer: 59 | Admitting: Physical Therapy

## 2017-02-22 ENCOUNTER — Ambulatory Visit: Payer: 59 | Attending: Internal Medicine | Admitting: Physical Therapy

## 2017-02-22 DIAGNOSIS — M546 Pain in thoracic spine: Secondary | ICD-10-CM | POA: Diagnosis present

## 2017-02-22 DIAGNOSIS — M545 Low back pain: Secondary | ICD-10-CM | POA: Insufficient documentation

## 2017-02-22 DIAGNOSIS — G8929 Other chronic pain: Secondary | ICD-10-CM | POA: Diagnosis present

## 2017-02-22 NOTE — Patient Instructions (Signed)
Flexion - painful from mid through lumbar spine   Extenison- good ROM with mild discomfort   L rotation - pain with just lower back   R rotation - mid and lower back pain reproduction  SLR - positive on R, negative on L

## 2017-02-24 NOTE — Therapy (Signed)
[facility] [address], [place], [address] Phone: [phone]   Fax:  [phone]  Physical Therapy Treatment  Patient Details  Name: Brandi Cole MRN: [id number] Date of Birth: 12/12/1985 Referring Provider: Halina Maidens   Encounter Date: 02/22/2017  PT End of Session - 02/24/17 2317    Visit Number  2    Number of Visits  17    Date for PT Re-Evaluation  04/05/17    Authorization Type  no g codes    PT Start Time  1000    PT Stop Time  1045    PT Time Calculation (min)  45 min    Activity Tolerance  Patient tolerated treatment well    Behavior During Therapy  [facility] for tasks assessed/performed       Past Medical History:  Diagnosis Date  . Back pain   . Fibroids   . Hypertension     No past surgical history on file.  There were no vitals filed for this visit.  Subjective Assessment - 02/24/17 2316    Subjective  Patient reports she wasn't able to tolerate the manual therapy performed in previous session, and is generally frustrated with how long her back pain has been ongoing.     Pertinent History  Pt referred for physical therapy for low and mid back pain. She describes low back pain as sharp and mid back pain as throbbing. 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 She reports that she "pulled a muscle" in her back in 2011 when she was reaching down for something and when she stood up she felt like her lower back "shattered." Pt reports that she waited for 3 days and when she went to the ER she states that the muscle had swollen significantly. She was prescribed a steroid, tramadol, and administered morphine. She was told that she "strained the muscle." Pain improved but never completely resolved. She states that she had a repeat injury in 2016. When she saw Aida Puffer she was prescribed methocarbamol and  naproxen. Naproxen doesn't help with her pain. She feels like the methocarbamol isn't helping as much anymore. She feels like she is taking too much of it when her pain is exacerbated even though she is only taking it as prescribed. Pt has a history of fibroids with associated pain in RLQ which does radiate to her back. She does notice that her back and abdominal pain coincide at times but but she does frequently have back pain without abdominal pain. She has an upcoming appointment with her OBGYN. Pt denies radiating pain into hip or LE's. Denies N/T. Back pain occasionally wakes her up at night. Denies fevers, chills, night sweats, unexplained weight gain/loss, no personal history of cancer. No history of pregnancy. She reports that when her back hurts bad she feels winded and will have some R or L sided chest pain. Pt reports no associated LUE symptoms or neck pain. No prior history of cardiac problems.         Limitations  Sitting    How long can you sit comfortably?  1.5 hours    How long can you stand comfortably?  30-40 minutes    How long can you walk comfortably?  No limitation    Diagnostic tests  Plain film radiographs: per pt "all normal"    Patient Stated Goals  "sit for  2-4 hours without having to change position"    Currently in Pain?  Yes    Pain Score  4     Pain Location  Back    Pain Orientation  Right;Left;Mid;Lower    Pain Descriptors / Indicators  Aching;Sharp    Pain Type  Chronic pain    Pain Onset  More than a month ago    Pain Frequency  Constant       Flexion - painful from mid through lumbar spine   Extenison- good ROM with mild discomfort   L rotation - pain with just lower back   R rotation - mid and lower back pain reproduction  SLR - positive on R, negative on L   Hip IR/ER WNL bilaterally with discomfort bilaterally, FABER and FADIR were mildly uncomfortable as well bilaterally   Sidelying resisted clamshell was mildly uncomfortable in lumbar spine    Supine bridge test - reported feeling pain and discomfort through lumbar paraspinals with each attempt including with toes up   CPAs- generally reports feeling mild pain throughout lumbar and thoracic vertebra with no clear epicenter.   Performed soft tissue mobilization over lumbar paraspinals into thoracic paraspinals, patient reported significant reduction in pain symptons and feeling of tightness to complete session, including with flexion and extension in standing. DIscussed use of foam roller at home to continue to provide soft tissue work.                        PT Education - 02/24/17 2317    Education provided  Yes    Education Details  Will perform soft tissue mobilizations to assist with pain control before a more active approach    Person(s) Educated  Patient    Methods  Explanation    Comprehension  Verbalized understanding       PT Short Term Goals - 02/08/17 1218      PT SHORT TERM GOAL #1   Title  Pt will be independent with HEP in order to improve strength and decrease pain to improve pain-free function at home and work.     Time  4    Period  Weeks    Status  New    Target Date  03/08/17        PT Long Term Goals - 02/08/17 1219      PT LONG TERM GOAL #1   Title  Pt will decrease mODI scoreby at least 13 points in order demonstrate clinically significant reduction in pain/disability     Baseline  02/08/17: 26%    Time  8    Period  Days    Status  New    Target Date  04/05/17      PT LONG TERM GOAL #2   Title  Pt will be able to sit for at least 3 hours prior to needing to stand and move in order to better perform her job tasks    Baseline  02/08/17: 1.5 hours    Time  8    Period  Weeks    Status  New    Target Date  04/05/17      PT LONG TERM GOAL #3   Title  Pt will decrease worst pain as reported on NPRS by at least 2 points in order to demonstrate clinically significant reduction in pain.     Baseline  02/08/17: worst:  10/10    Time  8    Period  Weeks  Status  New    Target Date  04/05/17            Plan - 02/24/17 2317    Clinical Impression Statement  Patient appears to have a very positive response to soft tissue mobilization in lumbar paraspinals this date, reporting significant decrease in stiffness/pain she had initially been experiencing. Discussed a foam roller for prolonged soft tissue work/relief away from clinic. Her objective testing was not clear in differntiating lumbar spine vs hips as source of discomfort, will continue to monitor and treat with manual therapy as indicated.     Clinical Presentation  Stable    Clinical Decision Making  Moderate    Rehab Potential  Good    PT Frequency  2x / week    PT Duration  8 weeks    PT Treatment/Interventions  Aquatic Therapy;Cryotherapy;Electrical Stimulation;Iontophoresis 4mg /ml Dexamethasone;Moist Heat;Traction;Ultrasound;Gait training;Stair training;Functional mobility training;Therapeutic activities;Therapeutic exercise;Balance training;Neuromuscular re-education;Patient/family education;Manual techniques;Passive range of motion;Dry needling;Energy conservation    PT Next Visit Plan  modalities as needed to interrupt pain cycle, progression to graded execise program    PT Home Exercise Plan  Hooklying pelvic tilts, hooklying gentle lumbar AROM rotation, single knee to chest stretch       Patient will benefit from skilled therapeutic intervention in order to improve the following deficits and impairments:  Obesity, Pain  Visit Diagnosis: Pain in thoracic spine  Chronic bilateral low back pain without sciatica     Problem List Patient Active Problem List   Diagnosis Date Noted  . Irregular menses 02/14/2016    Royce Macadamia 02/24/2017, 11:21 PM  [facility] [address] [place], [address] Phone: [phone]   Fax:  [phone]  Name: Brandi Cole MRN: [id number] Date of Birth: [birth year]

## 2017-03-01 ENCOUNTER — Ambulatory Visit: Payer: 59 | Admitting: Physical Therapy

## 2017-03-14 ENCOUNTER — Encounter: Payer: Self-pay | Admitting: Internal Medicine

## 2017-03-15 ENCOUNTER — Ambulatory Visit: Payer: 59 | Admitting: Physical Therapy

## 2017-03-15 DIAGNOSIS — M546 Pain in thoracic spine: Secondary | ICD-10-CM

## 2017-03-15 DIAGNOSIS — G8929 Other chronic pain: Secondary | ICD-10-CM

## 2017-03-15 DIAGNOSIS — M545 Low back pain, unspecified: Secondary | ICD-10-CM

## 2017-03-15 NOTE — Patient Instructions (Signed)
CPAs at T10, UPAs T8-10 on L side x 4 bouts x 30" per bout grade I which she felt some discomfort at L5 on R side, performed grade I mobilizations at L5 for 2 bouts x 30" per bout (still uncomfortable) {Performed quad stretching x 15" for 8 bouts x 2 sets bilaterally which significantly relieved her symptoms)   Squats -- pain in L side of flank, did 1/2 kneeling hip flexor stretching on LLE   Squats with 10# x 15 repetitions, with 20# x 15, 40# x12  Lunges x 6 per side with cuing for appropriate technique   Leg Press 105# x 15, 115# x 15, 135# x 15   Provided the above as part of HEP, along with SLDL,

## 2017-03-15 NOTE — Therapy (Signed)
[facility] Jul 15, [address] [place], [address] Phone: [phone]   Fax:  [phone]  Physical Therapy Treatment  Patient Details  Name: Shakala Marlatt MRN: [id number] Date of Birth: 1985-06-08 Referring Provider: Halina Maidens   Encounter Date: 03/15/2017  PT End of Session - 03/15/17 1103    Visit Number  3    Number of Visits  17    Date for PT Re-Evaluation  04/05/17    Authorization Type  no g codes    PT Start Time  07/16/06    PT Stop Time  1058    PT Time Calculation (min)  50 min    Activity Tolerance  Patient tolerated treatment well    Behavior During Therapy  [facility] for tasks assessed/performed       Past Medical History:  Diagnosis Date  . Back pain   . Fibroids   . Hypertension     No past surgical history on file.  There were no vitals filed for this visit.  Subjective Assessment - 03/15/17 1036    Subjective  Patient reports her back has been feeling much better most days, reports she still gets bouts of pain every now and again, today is one of those days. She would like to get back into an exercise program if she is able.     Pertinent History  Pt referred for physical therapy for low and mid back pain. She describes low back pain as sharp and mid back pain as throbbing. 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 She reports that she "pulled a muscle" in her back in 15-Jul-2009 when she was reaching down for something and when she stood up she felt like her lower back "shattered." Pt reports that she waited for 3 days and when she went to the ER she states that the muscle had swollen significantly. She was prescribed a steroid, tramadol, and administered morphine. She was told that she "strained the muscle." Pain improved but never completely resolved. She states that she had a repeat injury in 16-Jul-2014. When she saw  Aida Puffer she was prescribed methocarbamol and naproxen. Naproxen doesn't help with her pain. She feels like the methocarbamol isn't helping as much anymore. She feels like she is taking too much of it when her pain is exacerbated even though she is only taking it as prescribed. Pt has a history of fibroids with associated pain in RLQ which does radiate to her back. She does notice that her back and abdominal pain coincide at times but but she does frequently have back pain without abdominal pain. She has an upcoming appointment with her OBGYN. Pt denies radiating pain into hip or LE's. Denies N/T. Back pain occasionally wakes her up at night. Denies fevers, chills, night sweats, unexplained weight gain/loss, no personal history of cancer. No history of pregnancy. She reports that when her back hurts bad she feels winded and will have some R or L sided chest pain. Pt reports no associated LUE symptoms or neck pain. No prior history of cardiac problems.         Limitations  Sitting    How long can you sit comfortably?  1.5 hours    How long can you stand comfortably?  30-40 minutes    How long can you walk comfortably?  No limitation    Diagnostic tests  Plain  film radiographs: per pt "all normal"    Patient Stated Goals  "sit for 2-4 hours without having to change position"    Currently in Pain?  Yes    Pain Score  -- Reports mild to moderate discomfort in L flank around T9    Pain Location  Back    Pain Orientation  Left;Lower    Pain Descriptors / Indicators  Aching    Pain Type  Chronic pain    Pain Onset  More than a month ago    Pain Frequency  Intermittent       CPAs at T10, UPAs T8-10 on L side x 4 bouts x 30" per bout grade I which she felt some discomfort at L5 on R side, performed grade I mobilizations at L5 for 2 bouts x 30" per bout (still uncomfortable) {Performed quad stretching x 15" for 8 bouts x 2 sets bilaterally which significantly relieved her symptoms)   Squats -- pain  in L side of flank, did 1/2 kneeling hip flexor stretching on LLE   Squats with 10# x 15 repetitions, with 20# x 15, 40# x12  Lunges x 6 per side with cuing for appropriate technique   Leg Press 105# x 15, 115# x 15, 135# x 15   Provided the above as part of HEP, along with SLDL,                        PT Education - 03/15/17 1107    Education provided  Yes    Education Details  Provided updated HEP/gym program, follow up instructions.     Person(s) Educated  Patient    Methods  Explanation;Demonstration;Handout;Verbal cues    Comprehension  Verbal cues required;Returned demonstration;Verbalized understanding       PT Short Term Goals - 02/08/17 1218      PT SHORT TERM GOAL #1   Title  Pt will be independent with HEP in order to improve strength and decrease pain to improve pain-free function at home and work.     Time  4    Period  Weeks    Status  New    Target Date  03/08/17        PT Long Term Goals - 02/08/17 1219      PT LONG TERM GOAL #1   Title  Pt will decrease mODI scoreby at least 13 points in order demonstrate clinically significant reduction in pain/disability     Baseline  02/08/17: 26%    Time  8    Period  Days    Status  New    Target Date  04/05/17      PT LONG TERM GOAL #2   Title  Pt will be able to sit for at least 3 hours prior to needing to stand and move in order to better perform her job tasks    Baseline  02/08/17: 1.5 hours    Time  8    Period  Weeks    Status  New    Target Date  04/05/17      PT LONG TERM GOAL #3   Title  Pt will decrease worst pain as reported on NPRS by at least 2 points in order to demonstrate clinically significant reduction in pain.     Baseline  02/08/17: worst: 10/10    Time  8    Period  Weeks    Status  New    Target Date  04/05/17  Plan - 03/15/17 1103    Clinical Impression Statement  Patient has had excellent pain relief since previous PT session, and has really  benefitted from foam roller. She was provided with exercise routine for the gym this date, with no negative responses noted. She will follow up in 2-3 weeks for any modifications that need to be made before discharge.     Clinical Presentation  Stable    Clinical Decision Making  Moderate    Rehab Potential  Good    PT Frequency  2x / week    PT Duration  8 weeks    PT Treatment/Interventions  Aquatic Therapy;Cryotherapy;Electrical Stimulation;Iontophoresis 4mg /ml Dexamethasone;Moist Heat;Traction;Ultrasound;Gait training;Stair training;Functional mobility training;Therapeutic activities;Therapeutic exercise;Balance training;Neuromuscular re-education;Patient/family education;Manual techniques;Passive range of motion;Dry needling;Energy conservation    PT Next Visit Plan  modalities as needed to interrupt pain cycle, progression to graded execise program    PT Home Exercise Plan  Hooklying pelvic tilts, hooklying gentle lumbar AROM rotation, single knee to chest stretch       Patient will benefit from skilled therapeutic intervention in order to improve the following deficits and impairments:  Obesity, Pain  Visit Diagnosis: Pain in thoracic spine  Chronic bilateral low back pain without sciatica     Problem List Patient Active Problem List   Diagnosis Date Noted  . Irregular menses 02/14/2016   Royce Macadamia PT, DPT, CSCS    03/15/2017, 11:08 AM  [facility] [address] [place], [address] Phone: [phone]   Fax:  [phone]  Name: Yvonnie Schinke MRN: [id number] Date of Birth: Jan 24, 1986

## 2017-07-05 DIAGNOSIS — N920 Excessive and frequent menstruation with regular cycle: Secondary | ICD-10-CM | POA: Diagnosis not present

## 2017-07-05 DIAGNOSIS — B351 Tinea unguium: Secondary | ICD-10-CM | POA: Diagnosis not present

## 2017-07-05 DIAGNOSIS — M79674 Pain in right toe(s): Secondary | ICD-10-CM | POA: Diagnosis not present

## 2017-09-04 DIAGNOSIS — S29012A Strain of muscle and tendon of back wall of thorax, initial encounter: Secondary | ICD-10-CM | POA: Diagnosis not present

## 2017-10-16 ENCOUNTER — Encounter: Payer: Self-pay | Admitting: Internal Medicine

## 2017-10-18 ENCOUNTER — Ambulatory Visit: Payer: BLUE CROSS/BLUE SHIELD | Admitting: Internal Medicine

## 2017-10-18 ENCOUNTER — Encounter: Payer: Self-pay | Admitting: Internal Medicine

## 2017-10-18 ENCOUNTER — Other Ambulatory Visit: Payer: Self-pay | Admitting: Internal Medicine

## 2017-10-18 VITALS — BP 140/90 | HR 96 | Resp 16 | Ht 64.0 in | Wt 235.6 lb

## 2017-10-18 DIAGNOSIS — I10 Essential (primary) hypertension: Secondary | ICD-10-CM | POA: Diagnosis not present

## 2017-10-18 MED ORDER — IRBESARTAN-HYDROCHLOROTHIAZIDE 150-12.5 MG PO TABS: 1.0000 | ORAL_TABLET | Freq: Every day | ORAL | 5 refills | Status: DC

## 2017-10-18 MED ORDER — CYCLOBENZAPRINE HCL 5 MG PO TABS: 5.0000 mg | ORAL_TABLET | Freq: Two times a day (BID) | ORAL | 2 refills | Status: DC | PRN

## 2017-10-18 NOTE — Progress Notes (Signed)
Date:  10/18/2017   Name:  Brandi Cole   DOB:  [birth year]   MRN:  [id number]   Chief Complaint: Joint Swelling Swelling in feet - she is sedentary at work but no change recently.  Swelling started 2 weeks ago and only improves slightly with elevation.  On a relatively low salt diet.  New medication for fungal nails started in March.  She has gained about 18 lbs in the past year.  She was previously on HCTZ but had problems with getting frequent bathroom breaks, now working from home so this would not be an issues.  Hypertension  This is a chronic problem. The problem has been waxing and waning since onset. The problem is controlled. Pertinent negatives include no chest pain, headaches, palpitations or shortness of breath. Past treatments include angiotensin blockers. The current treatment provides significant improvement.     Review of Systems  Constitutional: Negative for chills, fatigue and fever.  Eyes: Negative for visual disturbance.  Respiratory: Negative for cough, chest tightness, shortness of breath and wheezing.   Cardiovascular: Positive for leg swelling. Negative for chest pain and palpitations.  Gastrointestinal: Negative for abdominal pain.  Musculoskeletal: Positive for myalgias.  Skin: Negative for color change and rash.  Neurological: Negative for dizziness and headaches.  Psychiatric/Behavioral: Negative for sleep disturbance.    Patient Active Problem List   Diagnosis Date Noted  . Irregular menses 02/14/2016    Prior to Admission medications   Medication Sig Start Date End Date Taking? Authorizing Provider         irbesartan (AVAPRO) 150 MG tablet Take 1 tablet (150 mg total) by mouth daily. 01/11/17   Glean Hess, MD  methocarbamol (ROBAXIN) 500 MG tablet Take 1 tablet (500 mg total) by mouth 3 (three) times daily. 01/11/17   Glean Hess, MD  naproxen (NAPROSYN) 500 MG tablet Take 1 tablet (500 mg total) by mouth 2 (two) times daily with a meal.  01/11/17   Glean Hess, MD    Allergies  Allergen Reactions  . Ace Inhibitors Cough    History reviewed. No pertinent surgical history.  Social History   Tobacco Use  . Smoking status: Former Smoker    Last attempt to quit: 09/15/2015    Years since quitting: 2.0  . Smokeless tobacco: Never Used  Substance Use Topics  . Alcohol use: Yes    Comment: weekend only mixed drinks   . Drug use: No     Medication list has been reviewed and updated.  Current Meds  Medication Sig  . Biotin 5000 MCG CAPS Take 1 capsule by mouth daily.  . Evening Primrose Oil 1000 MG CAPS Take 1 capsule by mouth daily.  . methocarbamol (ROBAXIN) 500 MG tablet Take 1 tablet (500 mg total) by mouth 3 (three) times daily.  . naproxen (NAPROSYN) 500 MG tablet Take 1 tablet (500 mg total) by mouth 2 (two) times daily with a meal.  . terbinafine (LAMISIL) 250 MG tablet   . TURMERIC CURCUMIN PO Take 550 mg by mouth daily.  . [DISCONTINUED] irbesartan (AVAPRO) 150 MG tablet Take 1 tablet (150 mg total) by mouth daily.    PHQ 2/9 Scores 01/11/2017  PHQ - 2 Score 0    Physical Exam  Constitutional: She is oriented to person, place, and time. She appears well-developed. No distress.  HENT:  Head: Normocephalic and atraumatic.  Neck: Normal range of motion. Neck supple.  Cardiovascular: Normal rate, regular rhythm and normal heart sounds.  No murmur heard. Pulmonary/Chest: Effort normal and breath sounds normal. No respiratory distress.  Musculoskeletal: Normal range of motion. She exhibits edema (trace ankle edema).  Neurological: She is alert and oriented to person, place, and time.  Skin: Skin is warm and dry. No rash noted.  Psychiatric: She has a normal mood and affect. Her behavior is normal. Thought content normal.  Nursing note and vitals reviewed.   BP 140/90   Pulse 96   Resp 16   Ht 5\' 4"  (1.626 m)   Wt 235 lb 9.6 oz (106.9 kg)   LMP 10/15/2017   SpO2 99%   BMI 40.44 kg/m    Assessment and Plan: 1. Essential hypertension Add hctz for mild edema Continue to elevate and reduce sodium Exercise and diet for weight loss - irbesartan-hydrochlorothiazide (AVALIDE) 150-12.5 MG tablet; Take 1 tablet by mouth daily.  Dispense: 30 tablet; Refill: 5 - Basic metabolic panel - TSH   Meds ordered this encounter  Medications  . irbesartan-hydrochlorothiazide (AVALIDE) 150-12.5 MG tablet    Sig: Take 1 tablet by mouth daily.    Dispense:  30 tablet    Refill:  5    Partially dictated using Editor, commissioning. Any errors are unintentional.  Halina Maidens, MD [facility]  10/18/2017

## 2017-10-19 LAB — TSH: TSH: 1.85 u[IU]/mL (ref 0.450–4.500)

## 2017-10-19 LAB — BASIC METABOLIC PANEL
BUN/Creatinine Ratio: 16 (ref 9–23)
BUN: 12 mg/dL (ref 6–20)
CO2: 20 mmol/L (ref 20–29)
CREATININE: 0.76 mg/dL (ref 0.57–1.00)
Calcium: 9.5 mg/dL (ref 8.7–10.2)
Chloride: 105 mmol/L (ref 96–106)
GFR, EST AFRICAN AMERICAN: 122 mL/min/{1.73_m2} (ref >59)
GFR, EST NON AFRICAN AMERICAN: 106 mL/min/{1.73_m2} (ref >59)
Glucose: 90 mg/dL (ref 65–99)
POTASSIUM: 4.8 mmol/L (ref 3.5–5.2)
SODIUM: 139 mmol/L (ref 134–144)

## 2017-10-20 IMAGING — CR DG THORACIC SPINE 2V
3 series · 3 of 3 positions shown · non-contrast
Comparison: 01/05/2011

CLINICAL DATA: Pt with mid back pain with spasms off and on for a
year. Pt hx of muscle strain of the thoracic spine area since 4700.
Pt denies hx of trauma, injury or surgery.

EXAM:
THORACIC SPINE 2 VIEWS

[t-spine ap]
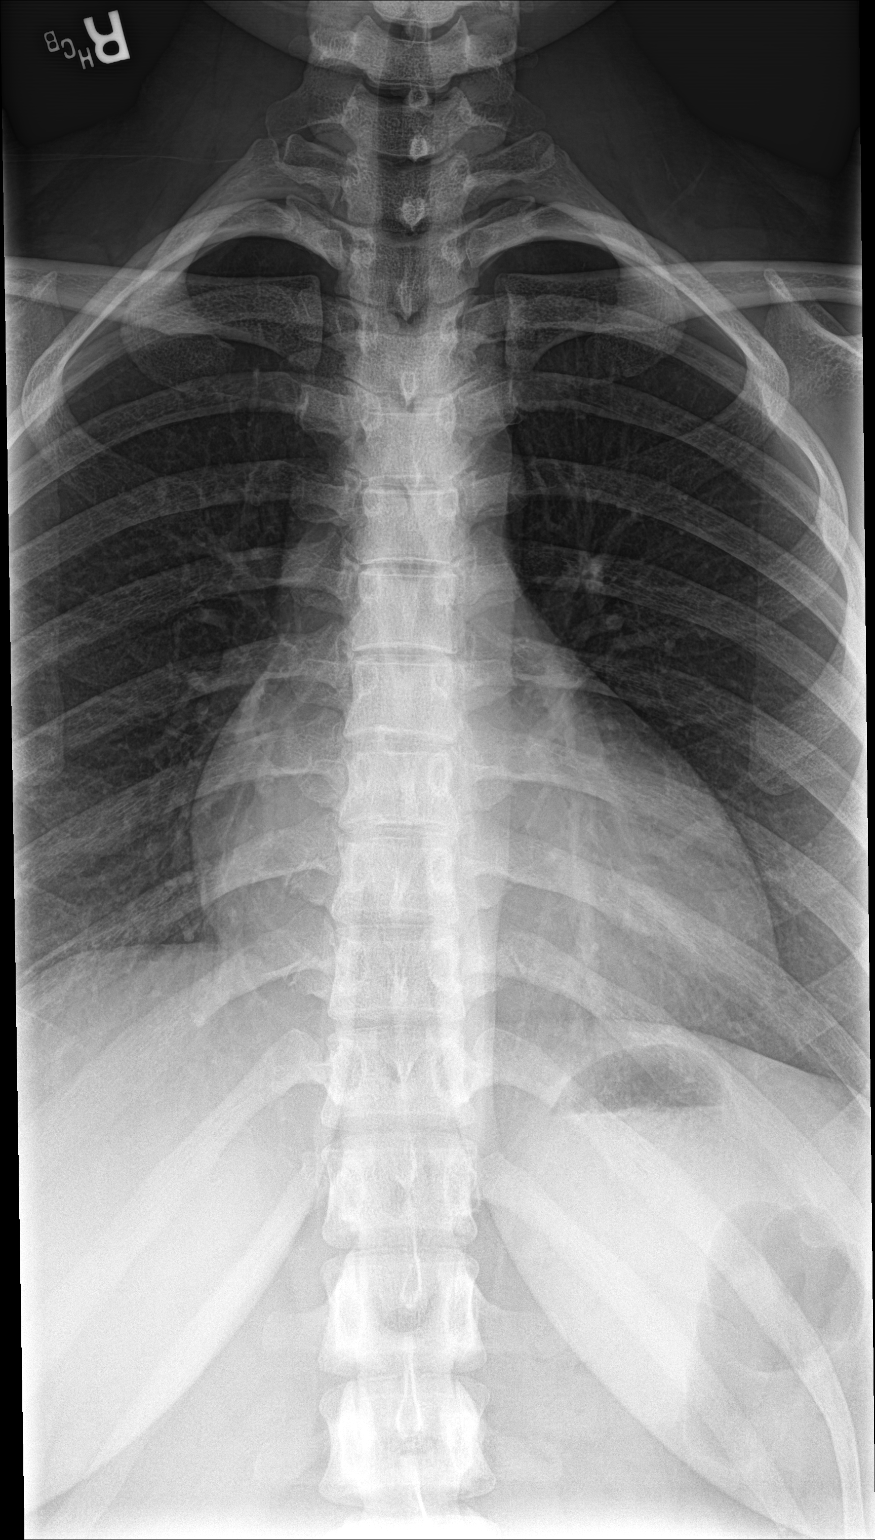

[t-spine lat]
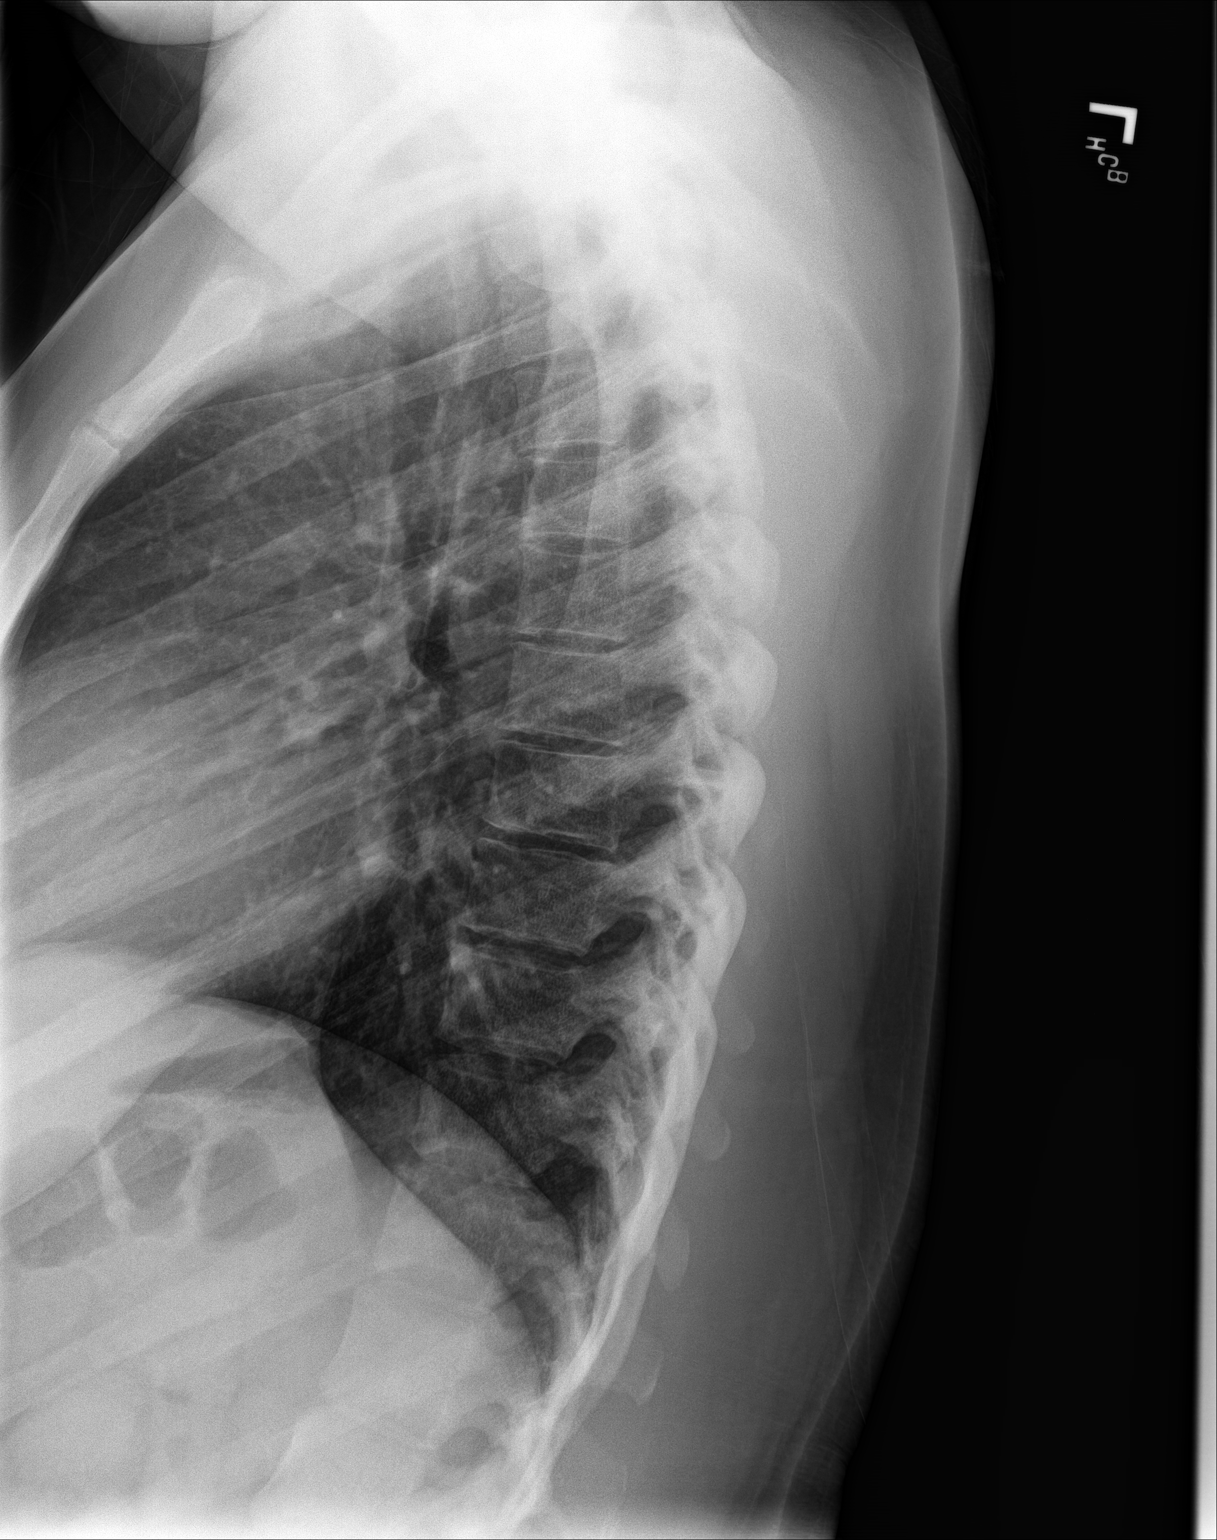

[t-spine swimmers]
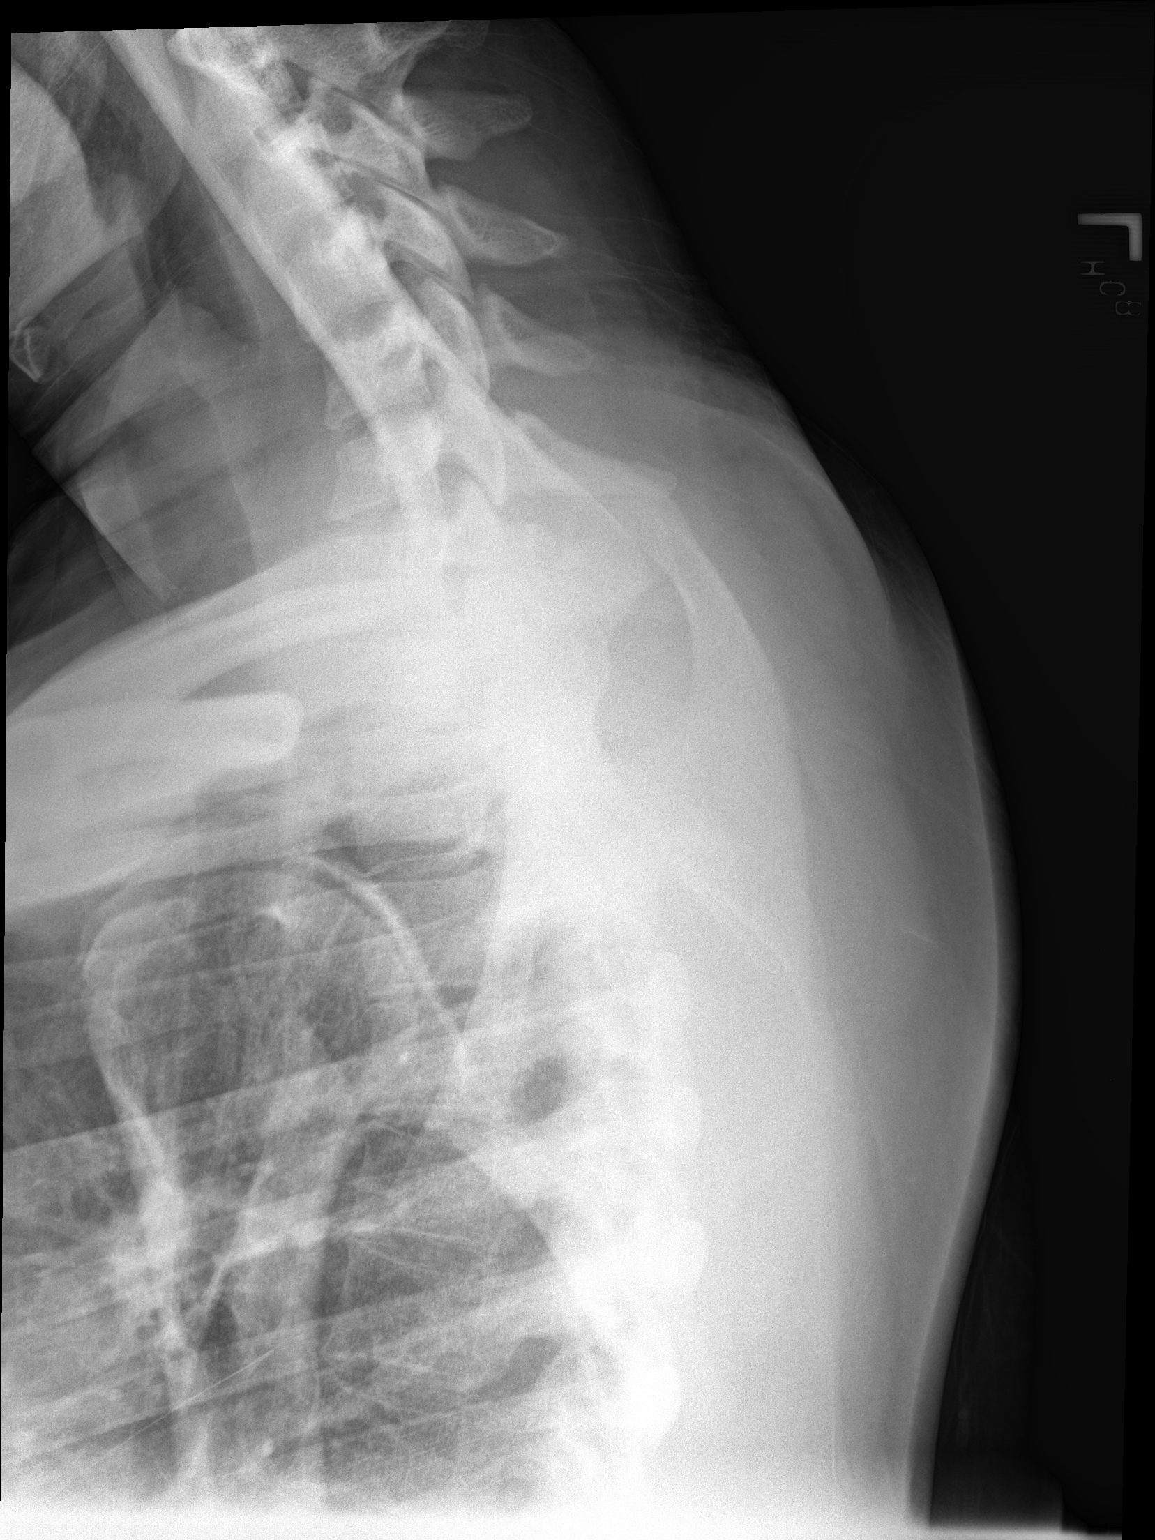

[3 of 3 positions shown; findings below may reference images not displayed]

FINDINGS: There is no evidence of thoracic spine fracture. Alignment is
normal. No other significant bone abnormalities are identified.
IMPRESSION: Negative.

## 2017-11-08 DIAGNOSIS — M79674 Pain in right toe(s): Secondary | ICD-10-CM | POA: Diagnosis not present

## 2017-11-08 DIAGNOSIS — B351 Tinea unguium: Secondary | ICD-10-CM | POA: Diagnosis not present

## 2018-01-23 ENCOUNTER — Encounter: Payer: Self-pay | Admitting: Internal Medicine

## 2018-01-23 ENCOUNTER — Ambulatory Visit: Payer: BLUE CROSS/BLUE SHIELD | Admitting: Internal Medicine

## 2018-01-23 VITALS — BP 142/90 | HR 80 | Ht 64.0 in | Wt 233.0 lb

## 2018-01-23 DIAGNOSIS — Z6839 Body mass index (BMI) 39.0-39.9, adult: Secondary | ICD-10-CM

## 2018-01-23 DIAGNOSIS — I10 Essential (primary) hypertension: Secondary | ICD-10-CM | POA: Insufficient documentation

## 2018-01-23 DIAGNOSIS — N649 Disorder of breast, unspecified: Secondary | ICD-10-CM | POA: Diagnosis not present

## 2018-01-23 DIAGNOSIS — L988 Other specified disorders of the skin and subcutaneous tissue: Secondary | ICD-10-CM

## 2018-01-23 MED ORDER — IRBESARTAN-HYDROCHLOROTHIAZIDE 150-12.5 MG PO TABS: 2.0000 | ORAL_TABLET | Freq: Every day | ORAL | 5 refills | Status: DC

## 2018-01-23 NOTE — Progress Notes (Signed)
Date:  01/23/2018   Name:  Brandi Cole   DOB:  07/23/1985   MRN:  [id number]   Chief Complaint: Hypertension (Ankles are swollen and wants to see if we can up fluid pill. ); BMI Form (Needs form filled out for work. ); and breast knot (On both breast but in two different areas. Noticed over the Summer. Feels like growing. Not painful)  Hypertension  This is a new problem. The current episode started more than 1 month ago. The problem has been gradually improving since onset. Pertinent negatives include no chest pain, headaches, palpitations or shortness of breath. Past treatments include angiotensin blockers. The current treatment provides significant improvement.   Breast lump - noticed in both breast several months ago. Not painful.  Obesity - she had gained quite a bit of weight and really is motivated to lose it.  Her diet is not bad but she does eat bread.  Her exercise routine in only twice a week.  She has never tried [employer] or similar programs.  Review of Systems  Constitutional: Positive for unexpected weight change. Negative for appetite change, fatigue and fever.  HENT: Negative for tinnitus and trouble swallowing.   Respiratory: Negative for cough, chest tightness and shortness of breath.   Cardiovascular: Negative for chest pain, palpitations and leg swelling.  Gastrointestinal: Negative for abdominal pain.  Genitourinary: Negative for dysuria and hematuria.  Musculoskeletal: Negative for arthralgias.  Skin:       Lumps near the nipples  Neurological: Negative for tremors, numbness and headaches.  Psychiatric/Behavioral: Positive for sleep disturbance. Negative for dysphoric mood.    Patient Active Problem List   Diagnosis Date Noted  . Essential hypertension 01/23/2018  . Irregular menses 02/14/2016    Allergies  Allergen Reactions  . Ace Inhibitors Cough    History reviewed. No pertinent surgical history.  Social History   Tobacco Use  . Smoking status:  Former Smoker    Last attempt to quit: 09/15/2015    Years since quitting: 2.3  . Smokeless tobacco: Never Used  Substance Use Topics  . Alcohol use: Yes    Comment: weekend only mixed drinks   . Drug use: No     Medication list has been reviewed and updated.  Current Meds  Medication Sig  . Biotin 5000 MCG CAPS Take 1 capsule by mouth daily.  . cyclobenzaprine (FLEXERIL) 5 MG tablet Take 1 tablet (5 mg total) by mouth 2 (two) times daily as needed for muscle spasms.  . Evening Primrose Oil 1000 MG CAPS Take 1 capsule by mouth daily.  . irbesartan-hydrochlorothiazide (AVALIDE) 150-12.5 MG tablet Take 1 tablet by mouth daily.  . Olopatadine HCl 0.2 % SOLN   . TURMERIC CURCUMIN PO Take 550 mg by mouth daily.    PHQ 2/9 Scores 01/23/2018 01/11/2017  PHQ - 2 Score 1 0    Physical Exam  Constitutional: She is oriented to person, place, and time. She appears well-developed. No distress.  HENT:  Head: Normocephalic and atraumatic.  Neck: Normal range of motion. Neck supple.  Cardiovascular: Normal rate, regular rhythm and normal heart sounds.  Pulmonary/Chest: Effort normal. No respiratory distress. Right breast exhibits no mass, no nipple discharge, no skin change and no tenderness. Left breast exhibits no mass, no nipple discharge, no skin change and no tenderness.    Musculoskeletal: Normal range of motion. She exhibits edema (trace ankle edema).  Neurological: She is alert and oriented to person, place, and time.  Skin: Skin is  warm and dry. No rash noted.  Psychiatric: She has a normal mood and affect. Her behavior is normal. Thought content normal.  Nursing note and vitals reviewed.   BP (!) 142/90 (BP Location: Right Arm, Patient Position: Sitting, Cuff Size: Normal)   Pulse 80   Ht 5\' 4"  (1.626 m)   Wt 233 lb (105.7 kg)   BMI 39.99 kg/m   Assessment and Plan: 1. Essential hypertension Double dose of medication to help control edema and BP Limit salt -  irbesartan-hydrochlorothiazide (AVALIDE) 150-12.5 MG tablet; Take 2 tablets by mouth daily.  Dispense: 60 tablet; Refill: 5  2. BMI 39.0-39.9,adult Begin [employer] Exercise 45 min 4-5 times per week Form for [facility] completed  3. Lesion of skin of breast - Ambulatory referral to Dermatology   Partially dictated using Dragon software. Any errors are unintentional.  Halina Maidens, MD [facility]  01/23/2018

## 2018-01-30 DIAGNOSIS — Z113 Encounter for screening for infections with a predominantly sexual mode of transmission: Secondary | ICD-10-CM | POA: Diagnosis not present

## 2018-01-30 DIAGNOSIS — Z01419 Encounter for gynecological examination (general) (routine) without abnormal findings: Secondary | ICD-10-CM | POA: Diagnosis not present

## 2018-01-30 DIAGNOSIS — N920 Excessive and frequent menstruation with regular cycle: Secondary | ICD-10-CM | POA: Diagnosis not present

## 2018-01-30 LAB — HM PAP SMEAR: HM Pap smear: NEGATIVE

## 2018-01-30 LAB — RESULTS CONSOLE HPV: CHL HPV: NEGATIVE

## 2018-02-07 DIAGNOSIS — Z113 Encounter for screening for infections with a predominantly sexual mode of transmission: Secondary | ICD-10-CM | POA: Diagnosis not present

## 2018-02-07 DIAGNOSIS — Z01419 Encounter for gynecological examination (general) (routine) without abnormal findings: Secondary | ICD-10-CM | POA: Diagnosis not present

## 2018-02-07 LAB — HIV ANTIBODY (ROUTINE TESTING W REFLEX): HIV 1&2 Ab, 4th Generation: NEGATIVE

## 2018-03-07 DIAGNOSIS — Z79899 Other long term (current) drug therapy: Secondary | ICD-10-CM | POA: Diagnosis not present

## 2018-03-07 DIAGNOSIS — L732 Hidradenitis suppurativa: Secondary | ICD-10-CM | POA: Diagnosis not present

## 2018-04-25 ENCOUNTER — Ambulatory Visit: Payer: BLUE CROSS/BLUE SHIELD | Admitting: Internal Medicine

## 2018-04-25 ENCOUNTER — Encounter: Payer: Self-pay | Admitting: Internal Medicine

## 2018-04-25 VITALS — BP 140/78 | HR 92 | Ht 64.0 in | Wt 238.0 lb

## 2018-04-25 DIAGNOSIS — G4720 Circadian rhythm sleep disorder, unspecified type: Secondary | ICD-10-CM

## 2018-04-25 DIAGNOSIS — I10 Essential (primary) hypertension: Secondary | ICD-10-CM | POA: Diagnosis not present

## 2018-04-25 MED ORDER — IRBESARTAN-HYDROCHLOROTHIAZIDE 150-12.5 MG PO TABS: 2.0000 | ORAL_TABLET | Freq: Every day | ORAL | 1 refills | Status: DC

## 2018-04-25 NOTE — Progress Notes (Signed)
Date:  04/25/2018   Name:  Brandi Cole   DOB:  12-07-1985   MRN:  [id number]   Chief Complaint: Hypertension (Follow up. )  Hypertension  This is a chronic problem. The problem has been gradually improving since onset. The problem is controlled. Pertinent negatives include no chest pain, headaches, palpitations or shortness of breath. Past treatments include angiotensin blockers and diuretics (dose increased last visit). The current treatment provides significant improvement.  Insomnia  Primary symptoms: sleep disturbance, premature morning awakening.  The onset quality is undetermined. The treatment provided mild (tried melatonin) relief.    Review of Systems  Constitutional: Positive for unexpected weight change (gained 5 lbs over the holiday). Negative for chills, fatigue and fever.  Respiratory: Negative for cough, chest tightness, shortness of breath and wheezing.   Cardiovascular: Negative for chest pain, palpitations and leg swelling.  Gastrointestinal: Negative for constipation.  Neurological: Negative for dizziness and headaches.  Psychiatric/Behavioral: Positive for sleep disturbance. Negative for dysphoric mood. The patient has insomnia. The patient is not nervous/anxious.     Patient Active Problem List   Diagnosis Date Noted  . Essential hypertension 01/23/2018  . Irregular menses 02/14/2016    Allergies  Allergen Reactions  . Ace Inhibitors Cough    History reviewed. No pertinent surgical history.  Social History   Tobacco Use  . Smoking status: Former Smoker    Last attempt to quit: 09/15/2015    Years since quitting: 2.6  . Smokeless tobacco: Never Used  Substance Use Topics  . Alcohol use: Yes    Comment: weekend only mixed drinks   . Drug use: No     Medication list has been reviewed and updated.  Current Meds  Medication Sig  . Biotin 5000 MCG CAPS Take 1 capsule by mouth daily.  . cyclobenzaprine (FLEXERIL) 5 MG tablet Take 1 tablet (5 mg  total) by mouth 2 (two) times daily as needed for muscle spasms.  . Evening Primrose Oil 1000 MG CAPS Take 1 capsule by mouth daily.  . irbesartan-hydrochlorothiazide (AVALIDE) 150-12.5 MG tablet Take 2 tablets by mouth daily.  . naproxen (NAPROSYN) 500 MG tablet Take 1 tablet (500 mg total) by mouth 2 (two) times daily with a meal.  . Olopatadine HCl 0.2 % SOLN   . tranexamic acid (LYSTEDA) 650 MG TABS tablet Take by mouth.  . TURMERIC CURCUMIN PO Take 550 mg by mouth daily.    PHQ 2/9 Scores 01/23/2018 01/11/2017  PHQ - 2 Score 1 0    Physical Exam Vitals signs and nursing note reviewed.  Constitutional:      General: She is not in acute distress.    Appearance: She is well-developed.  HENT:     Head: Normocephalic and atraumatic.  Eyes:     Pupils: Pupils are equal, round, and reactive to light.  Neck:     Musculoskeletal: Normal range of motion and neck supple.  Cardiovascular:     Rate and Rhythm: Normal rate and regular rhythm.     Pulses: Normal pulses.     Heart sounds: Normal heart sounds.  Pulmonary:     Effort: Pulmonary effort is normal. No respiratory distress.  Musculoskeletal: Normal range of motion.     Right lower leg: No edema.     Left lower leg: No edema.  Skin:    General: Skin is warm and dry.     Findings: No rash.  Neurological:     Mental Status: She is alert and oriented  to person, place, and time.  Psychiatric:        Behavior: Behavior normal.        Thought Content: Thought content normal.    Wt Readings from Last 3 Encounters:  04/25/18 238 lb (108 kg)  01/23/18 233 lb (105.7 kg)  10/18/17 235 lb 9.6 oz (106.9 kg)    BP 140/78 (BP Location: Right Arm, Patient Position: Sitting, Cuff Size: Large)   Pulse 92   Ht 5\' 4"  (1.626 m)   Wt 238 lb (108 kg)   SpO2 97%   BMI 40.85 kg/m   Assessment and Plan: 1. Essential hypertension Controlled, continue current therapy Begin regular exercise and work on weight loss -  irbesartan-hydrochlorothiazide (AVALIDE) 150-12.5 MG tablet; Take 2 tablets by mouth daily.  Dispense: 180 tablet; Refill: 1 - CBC with Differential/Platelet - Comprehensive metabolic panel  2. Sleep pattern disturbance Trial of Melatonin recommended   Partially dictated using Editor, commissioning. Any errors are unintentional.  Halina Maidens, MD [facility]  04/25/2018

## 2018-09-05 DIAGNOSIS — S86111A Strain of other muscle(s) and tendon(s) of posterior muscle group at lower leg level, right leg, initial encounter: Secondary | ICD-10-CM | POA: Diagnosis not present

## 2018-09-11 DIAGNOSIS — R262 Difficulty in walking, not elsewhere classified: Secondary | ICD-10-CM | POA: Diagnosis not present

## 2018-09-11 DIAGNOSIS — M25571 Pain in right ankle and joints of right foot: Secondary | ICD-10-CM | POA: Diagnosis not present

## 2018-09-11 DIAGNOSIS — M6281 Muscle weakness (generalized): Secondary | ICD-10-CM | POA: Diagnosis not present

## 2018-09-18 DIAGNOSIS — M25571 Pain in right ankle and joints of right foot: Secondary | ICD-10-CM | POA: Diagnosis not present

## 2018-09-18 DIAGNOSIS — M6281 Muscle weakness (generalized): Secondary | ICD-10-CM | POA: Diagnosis not present

## 2018-09-18 DIAGNOSIS — R262 Difficulty in walking, not elsewhere classified: Secondary | ICD-10-CM | POA: Diagnosis not present

## 2018-09-25 DIAGNOSIS — M6281 Muscle weakness (generalized): Secondary | ICD-10-CM | POA: Diagnosis not present

## 2018-09-25 DIAGNOSIS — M25571 Pain in right ankle and joints of right foot: Secondary | ICD-10-CM | POA: Diagnosis not present

## 2018-09-25 DIAGNOSIS — R262 Difficulty in walking, not elsewhere classified: Secondary | ICD-10-CM | POA: Diagnosis not present

## 2018-10-02 DIAGNOSIS — M25571 Pain in right ankle and joints of right foot: Secondary | ICD-10-CM | POA: Diagnosis not present

## 2018-10-02 DIAGNOSIS — M6281 Muscle weakness (generalized): Secondary | ICD-10-CM | POA: Diagnosis not present

## 2018-10-02 DIAGNOSIS — R262 Difficulty in walking, not elsewhere classified: Secondary | ICD-10-CM | POA: Diagnosis not present

## 2018-10-30 ENCOUNTER — Ambulatory Visit: Payer: BLUE CROSS/BLUE SHIELD | Admitting: Internal Medicine

## 2018-11-07 ENCOUNTER — Other Ambulatory Visit: Payer: Self-pay

## 2018-11-07 ENCOUNTER — Ambulatory Visit (INDEPENDENT_AMBULATORY_CARE_PROVIDER_SITE_OTHER): Payer: BC Managed Care – PPO | Admitting: Internal Medicine

## 2018-11-07 ENCOUNTER — Encounter: Payer: Self-pay | Admitting: Internal Medicine

## 2018-11-07 VITALS — BP 124/80 | HR 95 | Temp 98.1°F | Ht 64.0 in | Wt 235.0 lb

## 2018-11-07 DIAGNOSIS — S86819A Strain of other muscle(s) and tendon(s) at lower leg level, unspecified leg, initial encounter: Secondary | ICD-10-CM | POA: Diagnosis not present

## 2018-11-07 DIAGNOSIS — I10 Essential (primary) hypertension: Secondary | ICD-10-CM

## 2018-11-07 DIAGNOSIS — F5101 Primary insomnia: Secondary | ICD-10-CM | POA: Diagnosis not present

## 2018-11-07 MED ORDER — TEMAZEPAM 15 MG PO CAPS: 15.0000 mg | ORAL_CAPSULE | Freq: Every evening | ORAL | 0 refills | Status: DC | PRN

## 2018-11-07 MED ORDER — CYCLOBENZAPRINE HCL 5 MG PO TABS: 5.0000 mg | ORAL_TABLET | Freq: Two times a day (BID) | ORAL | 2 refills | Status: AC | PRN

## 2018-11-07 MED ORDER — IRBESARTAN-HYDROCHLOROTHIAZIDE 150-12.5 MG PO TABS: 2.0000 | ORAL_TABLET | Freq: Every day | ORAL | 1 refills | Status: DC

## 2018-11-07 NOTE — Progress Notes (Signed)
Date:  11/07/2018   Name:  Brandi Cole   DOB:  [birth year]   MRN:  [id number]   Chief Complaint: Hypertension (Follow up. ) and Insomnia (Not sleeping. Unable to fall asleep. 4-5 hours of sleep at night. Works 10 hours a day. Melatonin and Zquil did no help. )  Hypertension This is a chronic problem. The problem is controlled. Pertinent negatives include no chest pain, headaches, palpitations or shortness of breath. Past treatments include angiotensin blockers and diuretics. The current treatment provides significant improvement.  Insomnia Primary symptoms: sleep disturbance, difficulty falling asleep.  The onset quality is undetermined. The problem occurs nightly. The problem is unchanged. Past treatments include medication. The treatment provided no relief.  Leg Pain  The incident occurred at home. Injury mechanism: tearing injury. The quality of the pain is described as aching and burning. Treatments tried: did some PT, Advil, Flexeril for back starin.    Review of Systems  Constitutional: Negative for chills, fatigue and fever.  HENT: Negative for trouble swallowing.   Respiratory: Negative for cough, chest tightness, shortness of breath and wheezing.   Cardiovascular: Negative for chest pain and palpitations.  Neurological: Negative for dizziness and headaches.  Psychiatric/Behavioral: Positive for sleep disturbance. Negative for dysphoric mood. The patient has insomnia. The patient is not nervous/anxious.     Patient Active Problem List   Diagnosis Date Noted  . Sleep pattern disturbance 04/25/2018  . Essential hypertension 01/23/2018  . Irregular menses 02/14/2016    Allergies  Allergen Reactions  . Ace Inhibitors Cough    History reviewed. No pertinent surgical history.  Social History   Tobacco Use  . Smoking status: Former Smoker    Quit date: 09/15/2015    Years since quitting: 3.1  . Smokeless tobacco: Never Used  Substance Use Topics  . Alcohol use: Yes     Comment: weekend only mixed drinks   . Drug use: No     Medication list has been reviewed and updated.  Current Meds  Medication Sig  . Biotin 5000 MCG CAPS Take 1 capsule by mouth daily.  . cyclobenzaprine (FLEXERIL) 5 MG tablet Take 1 tablet (5 mg total) by mouth 2 (two) times daily as needed for muscle spasms.  . Evening Primrose Oil 1000 MG CAPS Take 1 capsule by mouth daily.  Marland Kitchen ibuprofen (ADVIL) 600 MG tablet TK 1 T PO Q 6 H PRF PAIN  . irbesartan-hydrochlorothiazide (AVALIDE) 150-12.5 MG tablet Take 2 tablets by mouth daily.  . naproxen (NAPROSYN) 500 MG tablet Take 1 tablet (500 mg total) by mouth 2 (two) times daily with a meal.  . Olopatadine HCl 0.2 % SOLN   . tranexamic acid (LYSTEDA) 650 MG TABS tablet Take by mouth.  . TURMERIC CURCUMIN PO Take 550 mg by mouth daily.    PHQ 2/9 Scores 11/07/2018 01/23/2018 01/11/2017  PHQ - 2 Score 0 1 0  PHQ- 9 Score 6 - -    BP Readings from Last 3 Encounters:  11/07/18 124/80  04/25/18 140/78  01/23/18 (!) 142/90    Physical Exam Vitals signs and nursing note reviewed.  Constitutional:      General: She is not in acute distress.    Appearance: She is well-developed.  HENT:     Head: Normocephalic and atraumatic.  Neck:     Musculoskeletal: Normal range of motion.  Cardiovascular:     Rate and Rhythm: Normal rate and regular rhythm.     Pulses: Normal pulses.  Pulmonary:  Effort: Pulmonary effort is normal. No respiratory distress.     Breath sounds: No wheezing or rhonchi.  Musculoskeletal:     Right lower leg: No edema.     Left lower leg: No edema.  Lymphadenopathy:     Cervical: No cervical adenopathy.  Skin:    General: Skin is warm and dry.     Capillary Refill: Capillary refill takes less than 2 seconds.     Findings: No rash.  Neurological:     General: No focal deficit present.     Mental Status: She is alert and oriented to person, place, and time.  Psychiatric:        Behavior: Behavior normal.         Thought Content: Thought content normal.     Wt Readings from Last 3 Encounters:  11/07/18 235 lb (106.6 kg)  04/25/18 238 lb (108 kg)  01/23/18 233 lb (105.7 kg)    BP 124/80   Pulse 95   Temp 98.1 F (36.7 C) (Oral)   Ht 5\' 4"  (1.626 m)   Wt 235 lb (106.6 kg)   SpO2 98%   BMI 40.34 kg/m   Assessment and Plan: 1. Essential hypertension Controlled, check labs - CBC with Differential/Platelet - Comprehensive metabolic panel - TSH - irbesartan-hydrochlorothiazide (AVALIDE) 150-12.5 MG tablet; Take 2 tablets by mouth daily.  Dispense: 180 tablet; Refill: 1  2. Primary insomnia Begin medication - nightly for 1-2 weeks then PRN - temazepam (RESTORIL) 15 MG capsule; Take 1 capsule (15 mg total) by mouth at bedtime as needed for sleep.  Dispense: 30 capsule; Refill: 0  3. Strain of calf muscle, unspecified laterality, initial encounter Continue Advil Flexeril for associated back discomfort   Partially dictated using Editor, commissioning. Any errors are unintentional.  Halina Maidens, MD [facility]  11/07/2018

## 2018-11-08 LAB — COMPREHENSIVE METABOLIC PANEL
ALT: 13 IU/L (ref 0–32)
AST: 12 IU/L (ref 0–40)
Albumin/Globulin Ratio: 2 (ref 1.2–2.2)
Albumin: 4.5 g/dL (ref 3.8–4.8)
Alkaline Phosphatase: 68 IU/L (ref 39–117)
BUN/Creatinine Ratio: 15 (ref 9–23)
BUN: 11 mg/dL (ref 6–20)
Bilirubin Total: 0.3 mg/dL (ref 0.0–1.2)
CO2: 21 mmol/L (ref 20–29)
Calcium: 9.3 mg/dL (ref 8.7–10.2)
Chloride: 102 mmol/L (ref 96–106)
Creatinine, Ser: 0.71 mg/dL (ref 0.57–1.00)
GFR calc Af Amer: 131 mL/min/{1.73_m2} (ref >59)
GFR calc non Af Amer: 114 mL/min/{1.73_m2} (ref >59)
Globulin, Total: 2.2 g/dL (ref 1.5–4.5)
Glucose: 90 mg/dL (ref 65–99)
Potassium: 4.4 mmol/L (ref 3.5–5.2)
Sodium: 139 mmol/L (ref 134–144)
Total Protein: 6.7 g/dL (ref 6.0–8.5)

## 2018-11-08 LAB — CBC WITH DIFFERENTIAL/PLATELET
Basophils Absolute: 0.1 10*3/uL (ref 0.0–0.2)
Basos: 1 %
EOS (ABSOLUTE): 0.1 10*3/uL (ref 0.0–0.4)
Eos: 1 %
Hematocrit: 37.7 % (ref 34.0–46.6)
Hemoglobin: 12.1 g/dL (ref 11.1–15.9)
Immature Grans (Abs): 0 10*3/uL (ref 0.0–0.1)
Immature Granulocytes: 0 %
Lymphocytes Absolute: 2.3 10*3/uL (ref 0.7–3.1)
Lymphs: 29 %
MCH: 26.5 pg — ABNORMAL LOW (ref 26.6–33.0)
MCHC: 32.1 g/dL (ref 31.5–35.7)
MCV: 83 fL (ref 79–97)
Monocytes Absolute: 0.7 10*3/uL (ref 0.1–0.9)
Monocytes: 9 %
Neutrophils Absolute: 4.7 10*3/uL (ref 1.4–7.0)
Neutrophils: 60 %
Platelets: 357 10*3/uL (ref 150–450)
RBC: 4.57 x10E6/uL (ref 3.77–5.28)
RDW: 14 % (ref 11.7–15.4)
WBC: 7.9 10*3/uL (ref 3.4–10.8)

## 2018-11-08 LAB — TSH: TSH: 3.2 u[IU]/mL (ref 0.450–4.500)

## 2018-12-11 ENCOUNTER — Other Ambulatory Visit: Payer: Self-pay | Admitting: Internal Medicine

## 2018-12-11 ENCOUNTER — Encounter: Payer: Self-pay | Admitting: Internal Medicine

## 2018-12-11 DIAGNOSIS — F5101 Primary insomnia: Secondary | ICD-10-CM

## 2018-12-11 MED ORDER — TEMAZEPAM 15 MG PO CAPS: 15.0000 mg | ORAL_CAPSULE | Freq: Every evening | ORAL | 5 refills | Status: DC | PRN

## 2018-12-11 NOTE — Telephone Encounter (Signed)
Please advise patient medication question?

## 2018-12-11 NOTE — Telephone Encounter (Signed)
Need Rf on Temazepam.

## 2018-12-19 ENCOUNTER — Encounter: Payer: Self-pay | Admitting: Internal Medicine

## 2018-12-25 ENCOUNTER — Ambulatory Visit (INDEPENDENT_AMBULATORY_CARE_PROVIDER_SITE_OTHER): Payer: BC Managed Care – PPO | Admitting: Internal Medicine

## 2018-12-25 ENCOUNTER — Encounter: Payer: Self-pay | Admitting: Internal Medicine

## 2018-12-25 ENCOUNTER — Other Ambulatory Visit: Payer: Self-pay

## 2018-12-25 VITALS — BP 108/78 | HR 95 | Ht 64.0 in | Wt 228.0 lb

## 2018-12-25 DIAGNOSIS — N6002 Solitary cyst of left breast: Secondary | ICD-10-CM

## 2018-12-25 NOTE — Progress Notes (Signed)
Date:  12/25/2018   Name:  Brandi Cole   DOB:  1985-06-22   MRN:  [id number]   Chief Complaint: Breast Mass (Left breast knot. Was painful and now its not as painful as it was. Now just feels like pressure. X 1.5 weeks. Randomly popped up and no new changes. )  HPI Breast mass - noticed a tender lump under her left breast about 10 days ago.  It was the size of a silver dollar but has been getting progressively smaller.  It is no longer tender.  There was no drainage or odor.  Insomnia - she is doing very well on temazepam nightly as needed.  She rests well and does not wake up feeling hungover.  Review of Systems  Constitutional: Negative for chills, fatigue and fever.  Respiratory: Negative for cough, chest tightness, shortness of breath and wheezing.   Cardiovascular: Negative for chest pain and palpitations.  Psychiatric/Behavioral: Positive for sleep disturbance (meds are working well). The patient is not nervous/anxious.     Patient Active Problem List   Diagnosis Date Noted  . Sleep pattern disturbance 04/25/2018  . Essential hypertension 01/23/2018  . Irregular menses 02/14/2016    Allergies  Allergen Reactions  . Ace Inhibitors Cough    History reviewed. No pertinent surgical history.  Social History   Tobacco Use  . Smoking status: Former Smoker    Quit date: 09/15/2015    Years since quitting: 3.2  . Smokeless tobacco: Never Used  Substance Use Topics  . Alcohol use: Yes    Comment: weekend only mixed drinks   . Drug use: No     Medication list has been reviewed and updated.  Current Meds  Medication Sig  . Biotin 5000 MCG CAPS Take 1 capsule by mouth daily.  . cyclobenzaprine (FLEXERIL) 5 MG tablet Take 1 tablet (5 mg total) by mouth 2 (two) times daily as needed for muscle spasms.  . Evening Primrose Oil 1000 MG CAPS Take 1 capsule by mouth daily.  Marland Kitchen ibuprofen (ADVIL) 600 MG tablet TK 1 T PO Q 6 H PRF PAIN  . irbesartan-hydrochlorothiazide  (AVALIDE) 150-12.5 MG tablet Take 2 tablets by mouth daily.  . naproxen (NAPROSYN) 500 MG tablet Take 1 tablet (500 mg total) by mouth 2 (two) times daily with a meal.  . temazepam (RESTORIL) 15 MG capsule Take 1 capsule (15 mg total) by mouth at bedtime as needed for sleep.  . tranexamic acid (LYSTEDA) 650 MG TABS tablet Take by mouth.  . TURMERIC CURCUMIN PO Take 550 mg by mouth daily.    PHQ 2/9 Scores 12/25/2018 11/07/2018 01/23/2018 01/11/2017  PHQ - 2 Score 0 0 1 0  PHQ- 9 Score 0 6 - -    BP Readings from Last 3 Encounters:  12/25/18 108/78  11/07/18 124/80  04/25/18 140/78    Physical Exam Cardiovascular:     Rate and Rhythm: Normal rate and regular rhythm.     Pulses: Normal pulses.     Heart sounds: Normal heart sounds.  Chest:     Breasts:        Right: No swelling, bleeding, inverted nipple, mass, nipple discharge, skin change or tenderness.        Left: No swelling, bleeding, inverted nipple, mass, nipple discharge, skin change or tenderness.    Musculoskeletal:     Right lower leg: No edema.     Left lower leg: No edema.     Wt Readings from Last 3  Encounters:  12/25/18 228 lb (103.4 kg)  11/07/18 235 lb (106.6 kg)  04/25/18 238 lb (108 kg)    BP 108/78   Pulse 95   Ht 5\' 4"  (1.626 m)   Wt 228 lb (103.4 kg)   SpO2 98%   BMI 39.14 kg/m   Assessment and Plan: 1. Breast cyst, left Small cyst isolated to the skin beneath the left breast Much smaller by patient report Breast exam otherwise normal Pt to monitor for change   Partially dictated using Dragon software. Any errors are unintentional.  Halina Maidens, MD [facility]  12/25/2018

## 2019-02-19 DIAGNOSIS — N368 Other specified disorders of urethra: Secondary | ICD-10-CM | POA: Diagnosis not present

## 2019-02-19 DIAGNOSIS — Z113 Encounter for screening for infections with a predominantly sexual mode of transmission: Secondary | ICD-10-CM | POA: Diagnosis not present

## 2019-02-19 DIAGNOSIS — B373 Candidiasis of vulva and vagina: Secondary | ICD-10-CM | POA: Diagnosis not present

## 2019-03-17 DIAGNOSIS — Z03818 Encounter for observation for suspected exposure to other biological agents ruled out: Secondary | ICD-10-CM | POA: Diagnosis not present

## 2019-05-14 ENCOUNTER — Encounter: Payer: Self-pay | Admitting: Internal Medicine

## 2019-05-14 ENCOUNTER — Ambulatory Visit: Payer: BC Managed Care – PPO | Admitting: Internal Medicine

## 2019-05-14 ENCOUNTER — Other Ambulatory Visit: Payer: Self-pay

## 2019-05-14 VITALS — BP 136/80 | HR 97 | Temp 99.3°F | Ht 64.0 in | Wt 234.0 lb

## 2019-05-14 DIAGNOSIS — G4700 Insomnia, unspecified: Secondary | ICD-10-CM | POA: Insufficient documentation

## 2019-05-14 DIAGNOSIS — I10 Essential (primary) hypertension: Secondary | ICD-10-CM | POA: Diagnosis not present

## 2019-05-14 DIAGNOSIS — F5101 Primary insomnia: Secondary | ICD-10-CM

## 2019-05-14 NOTE — Progress Notes (Signed)
Date:  05/14/2019   Name:  Brandi Cole   DOB:  August 12, 1985   MRN:  [id number]   Chief Complaint: Hypertension (6 month follow up.)  Hypertension This is a chronic problem. The problem is controlled (at home 145/85). Pertinent negatives include no chest pain, headaches, palpitations or shortness of breath. Past treatments include angiotensin blockers and diuretics. Compliance problems include exercise (but starting to walking and exercise at home).   Insomnia Primary symptoms: sleep disturbance.  The problem occurs every several days. Progression since onset: improved sleep with temazepam.    Lab Results  Component Value Date   CREATININE 0.71 11/07/2018   BUN 11 11/07/2018   NA 139 11/07/2018   K 4.4 11/07/2018   CL 102 11/07/2018   CO2 21 11/07/2018   No results found for: CHOL, HDL, LDLCALC, LDLDIRECT, TRIG, CHOLHDL Lab Results  Component Value Date   TSH 3.200 11/07/2018   No results found for: HGBA1C   Review of Systems  Constitutional: Negative for appetite change, fatigue, fever and unexpected weight change.  HENT: Negative for tinnitus and trouble swallowing.   Eyes: Negative for visual disturbance.  Respiratory: Negative for cough, chest tightness, shortness of breath and wheezing.   Cardiovascular: Negative for chest pain, palpitations and leg swelling.  Gastrointestinal: Negative for abdominal pain.  Genitourinary: Negative for dysuria and hematuria.  Musculoskeletal: Negative for arthralgias.  Neurological: Negative for tremors, numbness and headaches.  Hematological: Negative for adenopathy.  Psychiatric/Behavioral: Positive for sleep disturbance. Negative for dysphoric mood. The patient has insomnia. The patient is not nervous/anxious.     Patient Active Problem List   Diagnosis Date Noted  . Sleep pattern disturbance 04/25/2018  . Essential hypertension 01/23/2018  . Irregular menses 02/14/2016    Allergies  Allergen Reactions  . Ace  Inhibitors Cough    History reviewed. No pertinent surgical history.  Social History   Tobacco Use  . Smoking status: Former Smoker    Quit date: 09/15/2015    Years since quitting: 3.6  . Smokeless tobacco: Never Used  Substance Use Topics  . Alcohol use: Yes    Comment: weekend only mixed drinks   . Drug use: No     Medication list has been reviewed and updated.  Current Meds  Medication Sig  . cyclobenzaprine (FLEXERIL) 5 MG tablet Take 1 tablet (5 mg total) by mouth 2 (two) times daily as needed for muscle spasms.  . irbesartan-hydrochlorothiazide (AVALIDE) 150-12.5 MG tablet Take 2 tablets by mouth daily.  . temazepam (RESTORIL) 15 MG capsule Take 1 capsule (15 mg total) by mouth at bedtime as needed for sleep.  . tranexamic acid (LYSTEDA) 650 MG TABS tablet Take by mouth. Once a month    PHQ 2/9 Scores 05/14/2019 12/25/2018 11/07/2018 01/23/2018  PHQ - 2 Score 0 0 0 1  PHQ- 9 Score - 0 6 -    BP Readings from Last 3 Encounters:  05/14/19 136/80  12/25/18 108/78  11/07/18 124/80    Physical Exam Vitals and nursing note reviewed.  Constitutional:      General: She is not in acute distress.    Appearance: She is well-developed.  HENT:     Head: Normocephalic and atraumatic.  Neck:     Vascular: No carotid bruit.  Cardiovascular:     Rate and Rhythm: Normal rate and regular rhythm.     Pulses: Normal pulses.     Heart sounds: No murmur.  Pulmonary:     Effort: Pulmonary  effort is normal. No respiratory distress.     Breath sounds: No wheezing or rhonchi.  Musculoskeletal:        General: Normal range of motion.     Right lower leg: No edema.     Left lower leg: No edema.  Lymphadenopathy:     Cervical: No cervical adenopathy.  Skin:    General: Skin is warm and dry.     Capillary Refill: Capillary refill takes less than 2 seconds.     Findings: No rash.  Neurological:     General: No focal deficit present.     Mental Status: She is alert and oriented  to person, place, and time.  Psychiatric:        Behavior: Behavior normal.        Thought Content: Thought content normal.     Wt Readings from Last 3 Encounters:  05/14/19 234 lb (106.1 kg)  12/25/18 228 lb (103.4 kg)  11/07/18 235 lb (106.6 kg)    BP 136/80   Pulse 97   Temp 99.3 F (37.4 C) (Oral)   Ht 5\' 4"  (1.626 m)   Wt 234 lb (106.1 kg)   SpO2 97%   BMI 40.17 kg/m   Assessment and Plan: 1. Essential hypertension Clinically stable exam with well controlled BP on avalide. Tolerating medications without side effects at this time. Pt to continue current regimen and low sodium diet; benefits of regular exercise as able discussed.  2. Primary insomnia Doing well on PRN temazepam   Partially dictated using Editor, commissioning. Any errors are unintentional.  Halina Maidens, MD [facility]  05/14/2019

## 2019-07-28 ENCOUNTER — Other Ambulatory Visit: Payer: Self-pay | Admitting: Internal Medicine

## 2019-07-28 DIAGNOSIS — F5101 Primary insomnia: Secondary | ICD-10-CM

## 2019-09-09 ENCOUNTER — Other Ambulatory Visit: Payer: Self-pay | Admitting: Internal Medicine

## 2019-09-09 DIAGNOSIS — I10 Essential (primary) hypertension: Secondary | ICD-10-CM

## 2019-11-27 ENCOUNTER — Other Ambulatory Visit: Payer: Self-pay

## 2019-11-27 ENCOUNTER — Ambulatory Visit (LOCAL_COMMUNITY_HEALTH_CENTER): Payer: BC Managed Care – PPO

## 2019-11-27 DIAGNOSIS — Z111 Encounter for screening for respiratory tuberculosis: Secondary | ICD-10-CM

## 2019-11-30 ENCOUNTER — Other Ambulatory Visit: Payer: Self-pay

## 2019-11-30 ENCOUNTER — Ambulatory Visit (LOCAL_COMMUNITY_HEALTH_CENTER): Payer: BC Managed Care – PPO

## 2019-11-30 DIAGNOSIS — Z111 Encounter for screening for respiratory tuberculosis: Secondary | ICD-10-CM

## 2019-11-30 LAB — TB SKIN TEST
Induration: 0 mm
TB Skin Test: NEGATIVE

## 2019-12-11 ENCOUNTER — Ambulatory Visit (INDEPENDENT_AMBULATORY_CARE_PROVIDER_SITE_OTHER): Payer: BC Managed Care – PPO | Admitting: Internal Medicine

## 2019-12-11 ENCOUNTER — Other Ambulatory Visit: Payer: Self-pay

## 2019-12-11 ENCOUNTER — Encounter: Payer: Self-pay | Admitting: Internal Medicine

## 2019-12-11 VITALS — BP 148/84 | HR 82 | Temp 98.9°F | Ht 64.0 in | Wt 224.0 lb

## 2019-12-11 DIAGNOSIS — Z6838 Body mass index (BMI) 38.0-38.9, adult: Secondary | ICD-10-CM | POA: Diagnosis not present

## 2019-12-11 DIAGNOSIS — I10 Essential (primary) hypertension: Secondary | ICD-10-CM | POA: Diagnosis not present

## 2019-12-11 DIAGNOSIS — F5101 Primary insomnia: Secondary | ICD-10-CM

## 2019-12-11 DIAGNOSIS — Z1159 Encounter for screening for other viral diseases: Secondary | ICD-10-CM | POA: Diagnosis not present

## 2019-12-11 DIAGNOSIS — Z0000 Encounter for general adult medical examination without abnormal findings: Secondary | ICD-10-CM

## 2019-12-11 LAB — POCT URINALYSIS DIPSTICK
Bilirubin, UA: NEGATIVE
Blood, UA: NEGATIVE
Glucose, UA: NEGATIVE
Ketones, UA: NEGATIVE
Leukocytes, UA: NEGATIVE
Nitrite, UA: NEGATIVE
Protein, UA: NEGATIVE
Spec Grav, UA: 1.015 (ref 1.010–1.025)
Urobilinogen, UA: 0.2 E.U./dL
pH, UA: 6.5 (ref 5.0–8.0)

## 2019-12-11 MED ORDER — IRBESARTAN-HYDROCHLOROTHIAZIDE 150-12.5 MG PO TABS: 2.0000 | ORAL_TABLET | Freq: Every day | ORAL | 3 refills | Status: DC

## 2019-12-11 MED ORDER — AMLODIPINE BESYLATE 5 MG PO TABS: 5.0000 mg | ORAL_TABLET | Freq: Every day | ORAL | 3 refills | Status: DC

## 2019-12-11 NOTE — Progress Notes (Signed)
Date:  12/11/2019   Name:  Brandi Cole   DOB:  [birth year]   MRN:  [id number]   Chief Complaint: Annual Exam (breast exam no pap)  Brandi Cole is a 34 y.o. female who presents today for her Complete Annual Exam. She feels well. She reports exercising gym 3x times a week . She reports she is sleeping well. Breast complaints none.  She has a BMI form from [place].  Pap smear: 01/2018 negative with cotesting   Immunization History  Administered Date(s) Administered  . PPD Test 11/27/2019    Hypertension This is a chronic problem. The problem is controlled. Pertinent negatives include no chest pain, headaches, palpitations or shortness of breath. Past treatments include angiotensin blockers and diuretics. The current treatment provides significant improvement. There are no compliance problems.     Lab Results  Component Value Date   CREATININE 0.71 11/07/2018   BUN 11 11/07/2018   NA 139 11/07/2018   K 4.4 11/07/2018   CL 102 11/07/2018   CO2 21 11/07/2018   No results found for: CHOL, HDL, LDLCALC, LDLDIRECT, TRIG, CHOLHDL Lab Results  Component Value Date   TSH 3.200 11/07/2018   No results found for: HGBA1C Lab Results  Component Value Date   WBC 7.9 11/07/2018   HGB 12.1 11/07/2018   HCT 37.7 11/07/2018   MCV 83 11/07/2018   PLT 357 11/07/2018   Lab Results  Component Value Date   ALT 13 11/07/2018   AST 12 11/07/2018   ALKPHOS 68 11/07/2018   BILITOT 0.3 11/07/2018     Review of Systems  Constitutional: Positive for unexpected weight change (has lost about 10 lbs with effort). Negative for chills, fatigue and fever.  HENT: Negative for congestion, hearing loss, tinnitus, trouble swallowing and voice change.   Eyes: Negative for visual disturbance.  Respiratory: Negative for cough, chest tightness, shortness of breath and wheezing.   Cardiovascular: Negative for chest pain, palpitations and leg swelling.  Gastrointestinal: Negative for abdominal  pain, constipation, diarrhea and vomiting.  Endocrine: Negative for polydipsia and polyuria.  Genitourinary: Negative for dysuria, frequency, genital sores, vaginal bleeding and vaginal discharge.  Musculoskeletal: Positive for back pain (upper and lower back- taking flexeril ). Negative for arthralgias, gait problem and joint swelling.  Skin: Negative for color change and rash.  Neurological: Negative for dizziness, tremors, light-headedness and headaches.  Hematological: Negative for adenopathy. Does not bruise/bleed easily.  Psychiatric/Behavioral: Negative for dysphoric mood and sleep disturbance. The patient is nervous/anxious (anxious sometimes).     Patient Active Problem List   Diagnosis Date Noted  . 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 Sleep pattern disturbance 04/25/2018  . Essential hypertension 01/23/2018  . Irregular menses 02/14/2016    Allergies  Allergen Reactions  . Ace Inhibitors Cough    History reviewed. No pertinent surgical history.  Social History   Tobacco Use  . Smoking status: Former Smoker    Quit date: 09/15/2015    Years since quitting: 4.2  . Smokeless tobacco: Never Used  Substance Use Topics  . Alcohol use: Yes    Comment: weekend only mixed drinks   . Drug use: No     Medication list has been reviewed and updated.  Current Meds  Medication Sig  . cyclobenzaprine (FLEXERIL) 5 MG tablet Take 1 tablet (5 mg total) by mouth 2 (two) times daily as needed for muscle spasms.  . irbesartan-hydrochlorothiazide (AVALIDE) 150-12.5 MG tablet Take 2 tablets by mouth daily.  . temazepam (  RESTORIL) 15 MG capsule TAKE 1 CAPSULE(15 MG) BY MOUTH AT BEDTIME AS NEEDED FOR SLEEP  . tranexamic acid (LYSTEDA) 650 MG TABS tablet Take by mouth. Once a month  . [DISCONTINUED] irbesartan-hydrochlorothiazide (AVALIDE) 150-12.5 MG tablet TAKE 2 TABLETS BY MOUTH DAILY    PHQ 2/9 Scores 12/11/2019 05/14/2019 12/25/2018 11/07/2018  PHQ - 2 Score 0 0 0 0  PHQ- 9 Score 0 - 0  6    GAD 7 : Generalized Anxiety Score 12/11/2019  Nervous, Anxious, on Edge 1  Control/stop worrying 1  Worry too much - different things 1  Trouble relaxing 1  Restless 0  Easily annoyed or irritable 1  Afraid - awful might happen 1  Total GAD 7 Score 6  Anxiety Difficulty Somewhat difficult    BP Readings from Last 3 Encounters:  12/11/19 (!) 148/84  05/14/19 136/80  12/25/18 108/78    Physical Exam Vitals and nursing note reviewed.  Constitutional:      General: She is not in acute distress.    Appearance: She is well-developed.  HENT:     Head: Normocephalic and atraumatic.     Right Ear: Tympanic membrane and ear canal normal.     Left Ear: Tympanic membrane and ear canal normal.     Nose:     Right Sinus: No maxillary sinus tenderness.     Left Sinus: No maxillary sinus tenderness.  Eyes:     General: No scleral icterus.       Right eye: No discharge.        Left eye: No discharge.     Conjunctiva/sclera: Conjunctivae normal.  Neck:     Thyroid: No thyromegaly.     Vascular: No carotid bruit.  Cardiovascular:     Rate and Rhythm: Normal rate and regular rhythm.     Pulses: Normal pulses.     Heart sounds: Normal heart sounds.  Pulmonary:     Effort: Pulmonary effort is normal. No respiratory distress.     Breath sounds: No wheezing.  Chest:     Breasts:        Right: No mass, nipple discharge, skin change or tenderness.        Left: No mass, nipple discharge, skin change or tenderness.  Abdominal:     General: Bowel sounds are normal.     Palpations: Abdomen is soft.     Tenderness: There is no abdominal tenderness.  Musculoskeletal:     Cervical back: Normal range of motion. No erythema.     Right lower leg: No edema.     Left lower leg: No edema.  Lymphadenopathy:     Cervical: No cervical adenopathy.  Skin:    General: Skin is warm and dry.     Findings: No rash.  Neurological:     General: No focal deficit present.     Mental Status: She is  alert and oriented to person, place, and time.     Cranial Nerves: No cranial nerve deficit.     Sensory: No sensory deficit.     Deep Tendon Reflexes: Reflexes are normal and symmetric.  Psychiatric:        Attention and Perception: Attention normal.        Mood and Affect: Mood normal.     Wt Readings from Last 3 Encounters:  12/11/19 224 lb (101.6 kg)  05/14/19 234 lb (106.1 kg)  12/25/18 228 lb (103.4 kg)    BP (!) 148/84   Pulse 82  Temp 98.9 F (37.2 C) (Oral)   Ht 5\' 4"  (1.626 m)   Wt 224 lb (101.6 kg)   LMP 12/03/2019   SpO2 97%   BMI 38.45 kg/m   Assessment and Plan: 1. Annual physical exam Exam is normal except for weight. Continue regular exercise and appropriate dietary changes. [facility] form completed. - Lipid panel - POCT urinalysis dipstick  2. Need for hepatitis C screening test - Hepatitis C antibody  3. Essential hypertension BP is not controlled.  At home running in the yellow range (140-150) Continue Avalide and add amlodipine 5 mg. - CBC with Differential/Platelet - Comprehensive metabolic panel - TSH - amLODipine (NORVASC) 5 MG tablet; Take 1 tablet (5 mg total) by mouth daily.  Dispense: 90 tablet; Refill: 3 - irbesartan-hydrochlorothiazide (AVALIDE) 150-12.5 MG tablet; Take 2 tablets by mouth daily.  Dispense: 180 tablet; Refill: 3  4. Primary insomnia Continue PRN Restoril  5. BMI 38.0-38.9,adult She has lost about 10 lbs so far with diet changes and exercise.   Partially dictated using Editor, commissioning. Any errors are unintentional.  Halina Maidens, MD [facility]  12/11/2019

## 2019-12-12 LAB — LIPID PANEL
Chol/HDL Ratio: 2.3 ratio (ref 0.0–4.4)
Cholesterol, Total: 98 mg/dL — ABNORMAL LOW (ref 100–199)
HDL: 42 mg/dL (ref >39)
LDL Chol Calc (NIH): 45 mg/dL (ref 0–99)
Triglycerides: 44 mg/dL (ref 0–149)
VLDL Cholesterol Cal: 11 mg/dL (ref 5–40)

## 2019-12-12 LAB — CBC WITH DIFFERENTIAL/PLATELET
Basophils Absolute: 0 10*3/uL (ref 0.0–0.2)
Basos: 1 %
EOS (ABSOLUTE): 0 10*3/uL (ref 0.0–0.4)
Eos: 0 %
Hematocrit: 41.3 % (ref 34.0–46.6)
Hemoglobin: 13 g/dL (ref 11.1–15.9)
Immature Grans (Abs): 0 10*3/uL (ref 0.0–0.1)
Immature Granulocytes: 0 %
Lymphocytes Absolute: 1.9 10*3/uL (ref 0.7–3.1)
Lymphs: 39 %
MCH: 26.2 pg — ABNORMAL LOW (ref 26.6–33.0)
MCHC: 31.5 g/dL (ref 31.5–35.7)
MCV: 83 fL (ref 79–97)
Monocytes Absolute: 0.4 10*3/uL (ref 0.1–0.9)
Monocytes: 7 %
Neutrophils Absolute: 2.6 10*3/uL (ref 1.4–7.0)
Neutrophils: 53 %
Platelets: 401 10*3/uL (ref 150–450)
RBC: 4.97 x10E6/uL (ref 3.77–5.28)
RDW: 13.4 % (ref 11.7–15.4)
WBC: 4.9 10*3/uL (ref 3.4–10.8)

## 2019-12-12 LAB — COMPREHENSIVE METABOLIC PANEL
ALT: 20 IU/L (ref 0–32)
AST: 17 IU/L (ref 0–40)
Albumin/Globulin Ratio: 1.6 (ref 1.2–2.2)
Albumin: 4.4 g/dL (ref 3.8–4.8)
Alkaline Phosphatase: 85 IU/L (ref 48–121)
BUN/Creatinine Ratio: 8 — ABNORMAL LOW (ref 9–23)
BUN: 6 mg/dL (ref 6–20)
Bilirubin Total: <0.2 mg/dL (ref 0.0–1.2)
CO2: 25 mmol/L (ref 20–29)
Calcium: 9.3 mg/dL (ref 8.7–10.2)
Chloride: 103 mmol/L (ref 96–106)
Creatinine, Ser: 0.71 mg/dL (ref 0.57–1.00)
GFR calc Af Amer: 130 mL/min/{1.73_m2} (ref >59)
GFR calc non Af Amer: 113 mL/min/{1.73_m2} (ref >59)
Globulin, Total: 2.7 g/dL (ref 1.5–4.5)
Glucose: 93 mg/dL (ref 65–99)
Potassium: 4.1 mmol/L (ref 3.5–5.2)
Sodium: 144 mmol/L (ref 134–144)
Total Protein: 7.1 g/dL (ref 6.0–8.5)

## 2019-12-12 LAB — HEPATITIS C ANTIBODY: Hep C Virus Ab: <0.1 s/co ratio (ref 0.0–0.9)

## 2019-12-12 LAB — TSH: TSH: 0.816 u[IU]/mL (ref 0.450–4.500)

## 2020-01-09 DIAGNOSIS — M7502 Adhesive capsulitis of left shoulder: Secondary | ICD-10-CM | POA: Diagnosis not present

## 2020-01-22 ENCOUNTER — Encounter: Payer: Self-pay | Admitting: Internal Medicine

## 2020-01-22 ENCOUNTER — Ambulatory Visit: Payer: BC Managed Care – PPO | Admitting: Internal Medicine

## 2020-01-22 ENCOUNTER — Other Ambulatory Visit: Payer: Self-pay

## 2020-01-22 VITALS — BP 124/78 | HR 88 | Ht 64.0 in | Wt 216.0 lb

## 2020-01-22 DIAGNOSIS — Z113 Encounter for screening for infections with a predominantly sexual mode of transmission: Secondary | ICD-10-CM | POA: Diagnosis not present

## 2020-01-22 DIAGNOSIS — I10 Essential (primary) hypertension: Secondary | ICD-10-CM

## 2020-01-22 DIAGNOSIS — M7502 Adhesive capsulitis of left shoulder: Secondary | ICD-10-CM | POA: Diagnosis not present

## 2020-01-22 DIAGNOSIS — N368 Other specified disorders of urethra: Secondary | ICD-10-CM | POA: Diagnosis not present

## 2020-01-22 NOTE — Progress Notes (Signed)
Date:  01/22/2020   Name:  Brandi Cole   DOB:  Oct 06, 1985   MRN:  [id number]   Chief Complaint: Hypertension (Follow up. Bp has been better now that she is working out several times a week, and losing weight. She also quit eating red meats.) and Shoulder Pain (Left shoulder pain- froze up and seen [facility]. Still having issue when lifting arm but was better after recieving steroid injection. Wants PT referral to [facility] off [address]. )  Hypertension This is a chronic problem. The problem has been gradually improving since onset. The problem is controlled (113/65). Pertinent negatives include no chest pain, headaches, palpitations, peripheral edema or shortness of breath.  Shoulder Pain  The pain is present in the left shoulder. This is a new problem. The current episode started 1 to 4 weeks ago. There has been no history of extremity trauma. The problem has been gradually improving. The quality of the pain is described as aching. The pain is moderate. Pertinent negatives include no fever. Treatments tried: steroid injection and ibuprofen. The treatment provided moderate relief.    Lab Results  Component Value Date   CREATININE 0.71 12/11/2019   BUN 6 12/11/2019   NA 144 12/11/2019   K 4.1 12/11/2019   CL 103 12/11/2019   CO2 25 12/11/2019   Lab Results  Component Value Date   CHOL 98 (L) 12/11/2019   HDL 42 12/11/2019   LDLCALC 45 12/11/2019   TRIG 44 12/11/2019   CHOLHDL 2.3 12/11/2019   Lab Results  Component Value Date   TSH 0.816 12/11/2019   No results found for: HGBA1C Lab Results  Component Value Date   WBC 4.9 12/11/2019   HGB 13.0 12/11/2019   HCT 41.3 12/11/2019   MCV 83 12/11/2019   PLT 401 12/11/2019   Lab Results  Component Value Date   ALT 20 12/11/2019   AST 17 12/11/2019   ALKPHOS 85 12/11/2019   BILITOT <0.2 12/11/2019     Review of Systems  Constitutional: Positive for unexpected weight change (has lost 20 lbs with effort).  Negative for chills, fatigue and fever.  Respiratory: Negative for chest tightness and shortness of breath.   Cardiovascular: Negative for chest pain, palpitations and leg swelling.  Gastrointestinal: Negative for constipation.  Neurological: Negative for dizziness and headaches.    Patient Active Problem List   Diagnosis Date Noted  . 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 Sleep pattern disturbance 04/25/2018  . Essential hypertension 01/23/2018  . Irregular menses 02/14/2016    Allergies  Allergen Reactions  . Ace Inhibitors Cough    History reviewed. No pertinent surgical history.  Social History   Tobacco Use  . Smoking status: Former Smoker    Quit date: 09/15/2015    Years since quitting: 4.3  . Smokeless tobacco: Never Used  Substance Use Topics  . Alcohol use: Yes    Comment: weekend only mixed drinks   . Drug use: No     Medication list has been reviewed and updated.  Current Meds  Medication Sig  . amLODipine (NORVASC) 5 MG tablet Take 1 tablet (5 mg total) by mouth daily. (Patient taking differently: Take 5 mg by mouth daily. )  . cyclobenzaprine (FLEXERIL) 5 MG tablet Take 1 tablet (5 mg total) by mouth 2 (two) times daily as needed for muscle spasms. (Patient taking differently: Take 5 mg by mouth 2 (two) times daily as needed for muscle spasms. )  .  irbesartan-hydrochlorothiazide (AVALIDE) 150-12.5 MG tablet Take 2 tablets by mouth daily. (Patient taking differently: Take 2 tablets by mouth daily. )  . temazepam (RESTORIL) 15 MG capsule TAKE 1 CAPSULE(15 MG) BY MOUTH AT BEDTIME AS NEEDED FOR SLEEP (Patient taking differently: No sig reported)  . tranexamic acid (LYSTEDA) 650 MG TABS tablet Take by mouth. Once a month for heavy menstrual bleeding    PHQ 2/9 Scores 01/22/2020 12/11/2019 05/14/2019 12/25/2018  PHQ - 2 Score 0 0 0 0  PHQ- 9 Score 2 0 - 0    GAD 7 : Generalized Anxiety Score 01/22/2020 12/11/2019  Nervous, Anxious, on Edge 2 1  Control/stop worrying 2  1  Worry too much - different things 2 1  Trouble relaxing 0 1  Restless 0 0  Easily annoyed or irritable 1 1  Afraid - awful might happen 0 1  Total GAD 7 Score 7 6  Anxiety Difficulty Not difficult at all Somewhat difficult    BP Readings from Last 3 Encounters:  01/22/20 124/78  12/11/19 (!) 148/84  05/14/19 136/80    Physical Exam Vitals and nursing note reviewed.  Constitutional:      General: She is not in acute distress.    Appearance: Normal appearance. She is well-developed.  HENT:     Head: Normocephalic and atraumatic.  Cardiovascular:     Rate and Rhythm: Normal rate and regular rhythm.  Pulmonary:     Effort: Pulmonary effort is normal. No respiratory distress.     Breath sounds: No wheezing or rhonchi.  Musculoskeletal:     Right shoulder: Normal.     Left shoulder: Bony tenderness present. No swelling, deformity or crepitus. Decreased range of motion. Normal strength.     Cervical back: Normal range of motion.     Right lower leg: No edema.     Left lower leg: No edema.  Skin:    General: Skin is warm and dry.     Findings: No rash.  Neurological:     Mental Status: She is alert and oriented to person, place, and time.  Psychiatric:        Behavior: Behavior normal.        Thought Content: Thought content normal.     Wt Readings from Last 3 Encounters:  01/22/20 216 lb (98 kg)  12/11/19 224 lb (101.6 kg)  05/14/19 234 lb (106.1 kg)    BP 124/78   Pulse 88   Ht 5\' 4"  (1.626 m)   Wt 216 lb (98 kg)   LMP 12/31/2019 (Exact Date)   SpO2 97%   BMI 37.08 kg/m   Assessment and Plan: 1. Essential hypertension Clinically stable exam with well controlled BP after the addition of amlodipine. Tolerating medications without side effects at this time. Pt to continue current regimen and low sodium diet; continue regular exercise and weight loss efforts  2. Adhesive capsulitis of left shoulder Improved after steroid injection but still uncomfortable -  Ambulatory referral to Physical Therapy   Partially dictated using Dragon software. Any errors are unintentional.  Halina Maidens, MD [facility]  01/22/2020

## 2020-01-29 ENCOUNTER — Telehealth: Payer: Self-pay

## 2020-01-29 NOTE — Telephone Encounter (Signed)
Copied from [place] [id number]. Topic: General - Inquiry >> Jan 29, 2020  1:37 PM Greggory Keen D wrote: Reason for CRM: Pt is wanting a copy of her appeal form for her employer.  She said she had a visit on 8/27 and Chasity told her they would send the form that day but her employer says they have not gotten anything   [phone]

## 2020-03-09 ENCOUNTER — Other Ambulatory Visit: Payer: Self-pay | Admitting: Internal Medicine

## 2020-03-09 DIAGNOSIS — F5101 Primary insomnia: Secondary | ICD-10-CM

## 2020-03-09 NOTE — Telephone Encounter (Signed)
Requested medication (s) are due for refill today: Yes  Requested medication (s) are on the active medication list: Yes  Last refill:  07/28/19  Future visit scheduled: Yes  Notes to clinic:  See request.    Requested Prescriptions  Pending Prescriptions Disp Refills   temazepam (RESTORIL) 15 MG capsule [Pharmacy Med Name: TEMAZEPAM 15MG  CAPSULES] 30 capsule     Sig: TAKE 1 CAPSULE(15 MG) BY MOUTH AT BEDTIME AS NEEDED FOR SLEEP      Not Delegated - Psychiatry:  Anxiolytics/Hypnotics Failed - 03/09/2020 10:54 AM      Failed - This refill cannot be delegated      Failed - Urine Drug Screen completed in last 360 days      Passed - Valid encounter within last 6 months    Recent Outpatient Visits           1 month ago Essential hypertension   [facility] Glean Hess, MD   2 months ago Annual physical exam   [facility] Glean Hess, MD   10 months ago Essential hypertension   [facility] Glean Hess, MD   1 year ago Breast cyst, left   [facility] Glean Hess, MD   1 year ago Essential hypertension   [facility] Glean Hess, MD       Future Appointments             In 4 months Army Melia Jesse Sans, MD [facility], [place]   In 9 months Army Melia Jesse Sans, MD [facility], [facility]

## 2020-03-09 NOTE — Telephone Encounter (Signed)
Please Advise. Last office visit 01/22/2020.   KP

## 2020-03-17 ENCOUNTER — Ambulatory Visit: Payer: BC Managed Care – PPO | Attending: Internal Medicine

## 2020-07-07 ENCOUNTER — Ambulatory Visit: Payer: BC Managed Care – PPO | Admitting: Internal Medicine

## 2020-07-07 ENCOUNTER — Encounter: Payer: Self-pay | Admitting: Internal Medicine

## 2020-07-07 ENCOUNTER — Other Ambulatory Visit: Payer: Self-pay

## 2020-07-07 VITALS — BP 124/78 | HR 92 | Ht 64.0 in | Wt 216.0 lb

## 2020-07-07 DIAGNOSIS — I10 Essential (primary) hypertension: Secondary | ICD-10-CM | POA: Diagnosis not present

## 2020-07-07 DIAGNOSIS — F5101 Primary insomnia: Secondary | ICD-10-CM | POA: Diagnosis not present

## 2020-07-07 DIAGNOSIS — M7918 Myalgia, other site: Secondary | ICD-10-CM | POA: Diagnosis not present

## 2020-07-07 NOTE — Progress Notes (Signed)
Date:  07/07/2020   Name:  Brandi Cole   DOB:  09/12/1985   MRN:  [id number]   Chief Complaint: Hypertension and Pelvic Pain (Rt pelvic/groin pain. When stretching back she feels a pulling pain. On and off for the last 2 months. )  Hypertension This is a chronic problem. Progression since onset: 120/80 at home. Pertinent negatives include no chest pain, headaches, palpitations or shortness of breath. Past treatments include angiotensin blockers, calcium channel blockers and diuretics. The current treatment provides significant improvement. There are no compliance problems.   Abdominal Pain This is a new problem. The current episode started more than 1 month ago. The onset quality is sudden. The problem occurs daily. The problem has been waxing and waning. The pain is located in the RLQ. The pain is mild. The quality of the pain is cramping. The abdominal pain does not radiate. Pertinent negatives include no arthralgias, constipation, diarrhea or headaches. Exacerbated by: twisting and leg raising. The pain is relieved by nothing.    Lab Results  Component Value Date   CREATININE 0.71 12/11/2019   BUN 6 12/11/2019   NA 144 12/11/2019   K 4.1 12/11/2019   CL 103 12/11/2019   CO2 25 12/11/2019   Lab Results  Component Value Date   CHOL 98 (L) 12/11/2019   HDL 42 12/11/2019   LDLCALC 45 12/11/2019   TRIG 44 12/11/2019   CHOLHDL 2.3 12/11/2019   Lab Results  Component Value Date   TSH 0.816 12/11/2019   No results found for: HGBA1C Lab Results  Component Value Date   WBC 4.9 12/11/2019   HGB 13.0 12/11/2019   HCT 41.3 12/11/2019   MCV 83 12/11/2019   PLT 401 12/11/2019   Lab Results  Component Value Date   ALT 20 12/11/2019   AST 17 12/11/2019   ALKPHOS 85 12/11/2019   BILITOT <0.2 12/11/2019     Review of Systems  Constitutional: Negative for fatigue and unexpected weight change.  HENT: Negative for nosebleeds.   Eyes: Negative for visual disturbance.   Respiratory: Negative for cough, chest tightness, shortness of breath and wheezing.   Cardiovascular: Negative for chest pain, palpitations and leg swelling.  Gastrointestinal: Positive for abdominal pain. Negative for constipation and diarrhea.  Genitourinary: Positive for menstrual problem (known fibroids). Negative for pelvic pain.  Musculoskeletal: Negative for arthralgias.  Neurological: Negative for dizziness, weakness, light-headedness and headaches.  Psychiatric/Behavioral: Positive for sleep disturbance (improved with medication).    Patient Active Problem List   Diagnosis Date Noted  . Adhesive capsulitis of left shoulder 01/22/2020  . 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 Sleep pattern disturbance 04/25/2018  . Essential hypertension 01/23/2018  . Irregular menses 02/14/2016    Allergies  Allergen Reactions  . Ace Inhibitors Cough    History reviewed. No pertinent surgical history.  Social History   Tobacco Use  . Smoking status: Former Smoker    Quit date: 09/15/2015    Years since quitting: 4.8  . Smokeless tobacco: Never Used  Substance Use Topics  . Alcohol use: Yes    Comment: weekend only mixed drinks   . Drug use: No     Medication list has been reviewed and updated.  Current Meds  Medication Sig  . amLODipine (NORVASC) 5 MG tablet Take 1 tablet (5 mg total) by mouth daily. (Patient taking differently: Take 5 mg by mouth daily.)  . cyclobenzaprine (FLEXERIL) 5 MG tablet Take 1 tablet (5 mg total) by mouth 2 (  two) times daily as needed for muscle spasms. (Patient taking differently: Take 5 mg by mouth 2 (two) times daily as needed for muscle spasms.)  . irbesartan-hydrochlorothiazide (AVALIDE) 150-12.5 MG tablet Take 2 tablets by mouth daily. (Patient taking differently: Take 2 tablets by mouth daily.)  . temazepam (RESTORIL) 15 MG capsule TAKE 1 CAPSULE(15 MG) BY MOUTH AT BEDTIME AS NEEDED FOR SLEEP  . tranexamic acid (LYSTEDA) 650 MG TABS tablet Take by  mouth. Once a month for heavy menstrual bleeding    PHQ 2/9 Scores 07/07/2020 01/22/2020 12/11/2019 05/14/2019  PHQ - 2 Score 0 0 0 0  PHQ- 9 Score 2 2 0 -    GAD 7 : Generalized Anxiety Score 07/07/2020 01/22/2020 12/11/2019  Nervous, Anxious, on Edge 0 2 1  Control/stop worrying 0 2 1  Worry too much - different things 0 2 1  Trouble relaxing 0 0 1  Restless 0 0 0  Easily annoyed or irritable 0 1 1  Afraid - awful might happen 0 0 1  Total GAD 7 Score 0 7 6  Anxiety Difficulty Not difficult at all Not difficult at all Somewhat difficult    BP Readings from Last 3 Encounters:  07/07/20 124/78  01/22/20 124/78  12/11/19 (!) 148/84    Physical Exam Vitals and nursing note reviewed.  Constitutional:      General: She is not in acute distress.    Appearance: She is well-developed.  HENT:     Head: Normocephalic and atraumatic.  Cardiovascular:     Rate and Rhythm: Normal rate and regular rhythm.     Pulses: Normal pulses.     Heart sounds: No murmur heard.   Pulmonary:     Effort: Pulmonary effort is normal. No respiratory distress.     Breath sounds: No wheezing or rhonchi.  Abdominal:     General: There is no distension.     Palpations: Abdomen is soft. There is no mass.     Tenderness: There is abdominal tenderness (over right lower abdomen at the pelvis - worse with leg raise).     Hernia: No hernia is present.  Musculoskeletal:        General: Normal range of motion.     Cervical back: Normal range of motion.     Right lower leg: No edema.     Left lower leg: No edema.  Lymphadenopathy:     Cervical: No cervical adenopathy.  Skin:    General: Skin is warm and dry.     Findings: No rash.  Neurological:     Mental Status: She is alert and oriented to person, place, and time.  Psychiatric:        Mood and Affect: Mood normal.        Behavior: Behavior normal.     Wt Readings from Last 3 Encounters:  07/07/20 216 lb (98 kg)  01/22/20 216 lb (98 kg)  12/11/19  224 lb (101.6 kg)    BP 124/78   Pulse 92   Ht 5\' 4"  (1.626 m)   Wt 216 lb (98 kg)   LMP 07/02/2020 (Exact Date)   SpO2 97%   BMI 37.08 kg/m   Assessment and Plan: 1. Essential hypertension Clinically stable exam with well controlled BP. Tolerating medications without side effects at this time. Pt to continue current regimen and low sodium diet; benefits of regular exercise as able discussed.  2. Muscular abdominal pain in right lower quadrant Try heat, rest Reassured no evidence of  hernia or abdominal pathology  3. Primary insomnia Doing well on PRN temazepam   Partially dictated using Editor, commissioning. Any errors are unintentional.  Halina Maidens, MD [facility]  07/07/2020

## 2020-11-10 ENCOUNTER — Encounter: Payer: Self-pay | Admitting: Internal Medicine

## 2020-11-10 ENCOUNTER — Other Ambulatory Visit: Payer: Self-pay | Admitting: Internal Medicine

## 2020-11-10 DIAGNOSIS — F5101 Primary insomnia: Secondary | ICD-10-CM

## 2020-11-10 MED ORDER — TEMAZEPAM 15 MG PO CAPS: 15.0000 mg | ORAL_CAPSULE | Freq: Every evening | ORAL | 0 refills | Status: DC | PRN

## 2020-11-10 NOTE — Telephone Encounter (Signed)
Please review.Last office visit 07/07/2020.  KP

## 2020-12-16 ENCOUNTER — Other Ambulatory Visit: Payer: Self-pay

## 2020-12-16 ENCOUNTER — Encounter: Payer: Self-pay | Admitting: Internal Medicine

## 2020-12-16 ENCOUNTER — Ambulatory Visit (INDEPENDENT_AMBULATORY_CARE_PROVIDER_SITE_OTHER): Payer: BC Managed Care – PPO | Admitting: Internal Medicine

## 2020-12-16 VITALS — BP 126/86 | HR 97 | Temp 98.1°F | Ht 64.0 in | Wt 220.0 lb

## 2020-12-16 DIAGNOSIS — Z0000 Encounter for general adult medical examination without abnormal findings: Secondary | ICD-10-CM | POA: Diagnosis not present

## 2020-12-16 DIAGNOSIS — I10 Essential (primary) hypertension: Secondary | ICD-10-CM

## 2020-12-16 LAB — POCT URINALYSIS DIPSTICK
Bilirubin, UA: NEGATIVE
Blood, UA: NEGATIVE
Glucose, UA: NEGATIVE
Leukocytes, UA: NEGATIVE
Nitrite, UA: NEGATIVE
Protein, UA: NEGATIVE
Spec Grav, UA: 1.025 (ref 1.010–1.025)
Urobilinogen, UA: 0.2 E.U./dL
pH, UA: 6 (ref 5.0–8.0)

## 2020-12-16 MED ORDER — IRBESARTAN-HYDROCHLOROTHIAZIDE 150-12.5 MG PO TABS: 2.0000 | ORAL_TABLET | Freq: Every day | ORAL | 3 refills | Status: DC

## 2020-12-16 MED ORDER — AMLODIPINE BESYLATE 5 MG PO TABS: 5.0000 mg | ORAL_TABLET | Freq: Every day | ORAL | 3 refills | Status: DC

## 2020-12-16 NOTE — Progress Notes (Signed)
Date:  12/16/2020   Name:  Brandi Cole   DOB:  09-20-1985   MRN:  [id number]   Chief Complaint: Annual Exam (Breast exam no pap) Brandi Cole is a 35 y.o. female who presents today for her Complete Annual Exam. She feels well. She reports exercising 2-3 times a week cardio and weights. She reports she is sleeping well. Breast complaints none.  Mammogram: not due Pap smear: 01/2018 neg with co-testing @ GYN Colonoscopy: none  Immunization History  Administered Date(s) Administered   PPD Test 11/27/2019    Hypertension This is a chronic problem. The problem is controlled. Pertinent negatives include no chest pain, headaches, palpitations or shortness of breath. Past treatments include angiotensin blockers, calcium channel blockers and diuretics. The current treatment provides significant improvement. There is no history of kidney disease, CAD/MI or CVA.   Lab Results  Component Value Date   CREATININE 0.71 12/11/2019   BUN 6 12/11/2019   NA 144 12/11/2019   K 4.1 12/11/2019   CL 103 12/11/2019   CO2 25 12/11/2019   Lab Results  Component Value Date   CHOL 98 (L) 12/11/2019   HDL 42 12/11/2019   LDLCALC 45 12/11/2019   TRIG 44 12/11/2019   CHOLHDL 2.3 12/11/2019   Lab Results  Component Value Date   TSH 0.816 12/11/2019   No results found for: HGBA1C Lab Results  Component Value Date   WBC 4.9 12/11/2019   HGB 13.0 12/11/2019   HCT 41.3 12/11/2019   MCV 83 12/11/2019   PLT 401 12/11/2019   Lab Results  Component Value Date   ALT 20 12/11/2019   AST 17 12/11/2019   ALKPHOS 85 12/11/2019   BILITOT <0.2 12/11/2019     Review of Systems  Constitutional:  Negative for chills, fatigue and fever.  HENT:  Negative for congestion, hearing loss, tinnitus, trouble swallowing and voice change.   Eyes:  Negative for visual disturbance.  Respiratory:  Negative for cough, chest tightness, shortness of breath and wheezing.   Cardiovascular:  Negative for chest  pain, palpitations and leg swelling.  Gastrointestinal:  Negative for abdominal pain, constipation, diarrhea and vomiting.  Endocrine: Negative for polydipsia and polyuria.  Genitourinary:  Negative for dysuria, frequency, genital sores, vaginal bleeding and vaginal discharge.  Musculoskeletal:  Negative for arthralgias, gait problem and joint swelling.  Skin:  Negative for color change and rash.  Neurological:  Negative for dizziness, tremors, light-headedness and headaches.  Hematological:  Negative for adenopathy. Does not bruise/bleed easily.  Psychiatric/Behavioral:  Negative for dysphoric mood and sleep disturbance. The patient is not nervous/anxious.    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 No pertinent surgical history.  Social History   Tobacco Use   Smoking status: Former    Types: Cigarettes    Quit date: 09/15/2015    Years since quitting: 5.2   Smokeless tobacco: Never  Substance Use Topics   Alcohol use: Yes    Comment: weekend only mixed drinks    Drug use: No     Medication list has been reviewed and updated.  Current Meds  Medication Sig   amLODipine (NORVASC) 5 MG tablet Take 1 tablet (5 mg total) by  mouth daily. (Patient taking differently: Take 5 mg by mouth daily.)   cyclobenzaprine (FLEXERIL) 5 MG tablet Take 1 tablet (5 mg total) by mouth 2 (two) times daily as needed for muscle spasms. (Patient taking differently: Take 5 mg by mouth 2 (two) times daily as needed for muscle spasms.)   irbesartan-hydrochlorothiazide (AVALIDE) 150-12.5 MG tablet Take 2 tablets by mouth daily. (Patient taking differently: Take 2 tablets by mouth daily.)   temazepam (RESTORIL) 15 MG capsule Take 1  capsule (15 mg total) by mouth at bedtime as needed for sleep.   tranexamic acid (LYSTEDA) 650 MG TABS tablet Take by mouth. Once a month for heavy menstrual bleeding    PHQ 2/9 Scores 12/16/2020 07/07/2020 01/22/2020 12/11/2019  PHQ - 2 Score 0 0 0 0  PHQ- 9 Score '4 2 2 '$ 0    GAD 7 : Generalized Anxiety Score 12/16/2020 07/07/2020 01/22/2020 12/11/2019  Nervous, Anxious, on Edge 1 0 2 1  Control/stop worrying 1 0 2 1  Worry too much - different things 1 0 2 1  Trouble relaxing 0 0 0 1  Restless 0 0 0 0  Easily annoyed or irritable 0 0 1 1  Afraid - awful might happen 0 0 0 1  Total GAD 7 Score 3 0 7 6  Anxiety Difficulty - Not difficult at all Not difficult at all Somewhat difficult    BP Readings from Last 3 Encounters:  12/16/20 126/86  07/07/20 124/78  01/22/20 124/78    Physical Exam Vitals and nursing note reviewed.  Constitutional:      General: She is not in acute distress.    Appearance: She is well-developed.  HENT:     Head: Normocephalic and atraumatic.     Right Ear: Tympanic membrane and ear canal normal.     Left Ear: Tympanic membrane and ear canal normal.     Nose:     Right Sinus: No maxillary sinus tenderness.     Left Sinus: No maxillary sinus tenderness.  Eyes:     General: No scleral icterus.       Right eye: No discharge.        Left eye: No discharge.     Conjunctiva/sclera: Conjunctivae normal.  Neck:     Thyroid: No thyromegaly.     Vascular: No carotid bruit.  Cardiovascular:     Rate and Rhythm: Normal rate and regular rhythm.     Pulses: Normal pulses.     Heart sounds: Normal heart sounds.  Pulmonary:     Effort: Pulmonary effort is normal. No respiratory distress.     Breath sounds: No wheezing.  Chest:  Breasts:    Right: No mass, nipple discharge, skin change or tenderness.     Left: No mass, nipple discharge, skin change or tenderness.  Abdominal:     General: Bowel sounds are normal.     Palpations: Abdomen is soft.     Tenderness:  There is no abdominal tenderness.  Musculoskeletal:     Cervical back: Normal range of motion. No erythema.     Right lower leg: No edema.     Left lower leg: No edema.  Lymphadenopathy:     Cervical: No cervical adenopathy.  Skin:    General: Skin is warm and dry.     Findings: No rash.  Neurological:     Mental Status: She is alert and oriented to person, place, and time.     Cranial Nerves: No cranial nerve deficit.  Sensory: No sensory deficit.     Deep Tendon Reflexes: Reflexes are normal and symmetric.  Psychiatric:        Attention and Perception: Attention normal.        Mood and Affect: Mood normal.    Wt Readings from Last 3 Encounters:  12/16/20 220 lb (99.8 kg)  07/07/20 216 lb (98 kg)  01/22/20 216 lb (98 kg)    BP 126/86   Pulse 97   Temp 98.1 F (36.7 C) (Oral)   Ht '5\' 4"'$  (1.626 m)   Wt 220 lb (99.8 kg)   LMP 12/09/2020   SpO2 99%   BMI 37.76 kg/m   Assessment and Plan: 1. Annual physical exam Exam is normal except for weight. Encourage regular exercise and appropriate dietary changes. She has plans to work on her diet and is getting back to regular exercise. She will see GYN in October. - Lipid panel  2. Essential hypertension Clinically stable exam with well controlled BP. Tolerating medications without side effects at this time. Pt to continue current regimen and low sodium diet; regular exercise  - CBC with Differential/Platelet - Comprehensive metabolic panel - TSH - POCT urinalysis dipstick - amLODipine (NORVASC) 5 MG tablet; Take 1 tablet (5 mg total) by mouth daily.  Dispense: 90 tablet; Refill: 3 - irbesartan-hydrochlorothiazide (AVALIDE) 150-12.5 MG tablet; Take 2 tablets by mouth daily.  Dispense: 180 tablet; Refill: 3   Partially dictated using Editor, commissioning. Any errors are unintentional.  Halina Maidens, MD [facility]  12/16/2020

## 2020-12-17 LAB — CBC WITH DIFFERENTIAL/PLATELET
Basophils Absolute: 0.1 10*3/uL (ref 0.0–0.2)
Basos: 1 %
EOS (ABSOLUTE): 0 10*3/uL (ref 0.0–0.4)
Eos: 1 %
Hematocrit: 39.8 % (ref 34.0–46.6)
Hemoglobin: 12.8 g/dL (ref 11.1–15.9)
Immature Grans (Abs): 0 10*3/uL (ref 0.0–0.1)
Immature Granulocytes: 0 %
Lymphocytes Absolute: 2.1 10*3/uL (ref 0.7–3.1)
Lymphs: 26 %
MCH: 26.2 pg — ABNORMAL LOW (ref 26.6–33.0)
MCHC: 32.2 g/dL (ref 31.5–35.7)
MCV: 81 fL (ref 79–97)
Monocytes Absolute: 0.7 10*3/uL (ref 0.1–0.9)
Monocytes: 8 %
Neutrophils Absolute: 5.1 10*3/uL (ref 1.4–7.0)
Neutrophils: 64 %
Platelets: 404 10*3/uL (ref 150–450)
RBC: 4.89 x10E6/uL (ref 3.77–5.28)
RDW: 13.1 % (ref 11.7–15.4)
WBC: 7.9 10*3/uL (ref 3.4–10.8)

## 2020-12-17 LAB — COMPREHENSIVE METABOLIC PANEL
ALT: 15 IU/L (ref 0–32)
AST: 19 IU/L (ref 0–40)
Albumin/Globulin Ratio: 1.7 (ref 1.2–2.2)
Albumin: 4.7 g/dL (ref 3.8–4.8)
Alkaline Phosphatase: 89 IU/L (ref 44–121)
BUN/Creatinine Ratio: 14 (ref 9–23)
BUN: 11 mg/dL (ref 6–20)
Bilirubin Total: 0.3 mg/dL (ref 0.0–1.2)
CO2: 24 mmol/L (ref 20–29)
Calcium: 9.8 mg/dL (ref 8.7–10.2)
Chloride: 99 mmol/L (ref 96–106)
Creatinine, Ser: 0.78 mg/dL (ref 0.57–1.00)
Globulin, Total: 2.8 g/dL (ref 1.5–4.5)
Glucose: 85 mg/dL (ref 65–99)
Potassium: 4.1 mmol/L (ref 3.5–5.2)
Sodium: 139 mmol/L (ref 134–144)
Total Protein: 7.5 g/dL (ref 6.0–8.5)
eGFR: 103 mL/min/{1.73_m2} (ref >59)

## 2020-12-17 LAB — LIPID PANEL
Chol/HDL Ratio: 2.5 ratio (ref 0.0–4.4)
Cholesterol, Total: 142 mg/dL (ref 100–199)
HDL: 57 mg/dL (ref >39)
LDL Chol Calc (NIH): 74 mg/dL (ref 0–99)
Triglycerides: 47 mg/dL (ref 0–149)
VLDL Cholesterol Cal: 11 mg/dL (ref 5–40)

## 2020-12-17 LAB — TSH: TSH: 1.35 u[IU]/mL (ref 0.450–4.500)

## 2021-01-27 DIAGNOSIS — Z113 Encounter for screening for infections with a predominantly sexual mode of transmission: Secondary | ICD-10-CM | POA: Diagnosis not present

## 2021-03-16 DIAGNOSIS — Z113 Encounter for screening for infections with a predominantly sexual mode of transmission: Secondary | ICD-10-CM | POA: Diagnosis not present

## 2021-03-16 DIAGNOSIS — Z01419 Encounter for gynecological examination (general) (routine) without abnormal findings: Secondary | ICD-10-CM | POA: Diagnosis not present

## 2021-03-16 DIAGNOSIS — Z1151 Encounter for screening for human papillomavirus (HPV): Secondary | ICD-10-CM | POA: Diagnosis not present

## 2021-03-16 DIAGNOSIS — Z124 Encounter for screening for malignant neoplasm of cervix: Secondary | ICD-10-CM | POA: Diagnosis not present

## 2021-03-16 LAB — HM PAP SMEAR: HM Pap smear: NEGATIVE

## 2021-03-16 LAB — RESULTS CONSOLE HPV: CHL HPV: NEGATIVE

## 2021-03-21 DIAGNOSIS — Z113 Encounter for screening for infections with a predominantly sexual mode of transmission: Secondary | ICD-10-CM | POA: Diagnosis not present

## 2021-03-23 DIAGNOSIS — Z30430 Encounter for insertion of intrauterine contraceptive device: Secondary | ICD-10-CM | POA: Diagnosis not present

## 2021-03-23 DIAGNOSIS — Z113 Encounter for screening for infections with a predominantly sexual mode of transmission: Secondary | ICD-10-CM | POA: Diagnosis not present

## 2021-03-23 DIAGNOSIS — Z01818 Encounter for other preprocedural examination: Secondary | ICD-10-CM | POA: Diagnosis not present

## 2021-04-27 DIAGNOSIS — Z30431 Encounter for routine checking of intrauterine contraceptive device: Secondary | ICD-10-CM | POA: Diagnosis not present

## 2021-04-27 DIAGNOSIS — T8332XA Displacement of intrauterine contraceptive device, initial encounter: Secondary | ICD-10-CM | POA: Diagnosis not present

## 2021-05-09 DIAGNOSIS — Z113 Encounter for screening for infections with a predominantly sexual mode of transmission: Secondary | ICD-10-CM | POA: Diagnosis not present

## 2021-05-26 DIAGNOSIS — T8332XA Displacement of intrauterine contraceptive device, initial encounter: Secondary | ICD-10-CM | POA: Diagnosis not present

## 2021-05-26 DIAGNOSIS — Z30431 Encounter for routine checking of intrauterine contraceptive device: Secondary | ICD-10-CM | POA: Diagnosis not present

## 2021-06-15 ENCOUNTER — Other Ambulatory Visit: Payer: Self-pay

## 2021-06-15 ENCOUNTER — Ambulatory Visit: Payer: BC Managed Care – PPO | Admitting: Internal Medicine

## 2021-06-15 ENCOUNTER — Encounter: Payer: Self-pay | Admitting: Internal Medicine

## 2021-06-15 VITALS — BP 118/84 | HR 99 | Ht 64.0 in | Wt 221.0 lb

## 2021-06-15 DIAGNOSIS — I10 Essential (primary) hypertension: Secondary | ICD-10-CM | POA: Diagnosis not present

## 2021-06-15 NOTE — Progress Notes (Signed)
? ? ?Date:  06/15/2021  ? ?Name:  Brandi Cole   DOB:  [birth year]   MRN:  [id number] ? ? ?Chief Complaint: Hypertension ? ?Hypertension ?This is a chronic problem. The problem is controlled (at home 120/70). Pertinent negatives include no chest pain, headaches, palpitations or shortness of breath. Past treatments include angiotensin blockers, diuretics and calcium channel blockers. The current treatment provides significant improvement. There is no history of kidney disease, CAD/MI or CVA.  ? ?Lab Results  ?Component Value Date  ? NA 139 12/16/2020  ? K 4.1 12/16/2020  ? CO2 24 12/16/2020  ? GLUCOSE 85 12/16/2020  ? BUN 11 12/16/2020  ? CREATININE 0.78 12/16/2020  ? CALCIUM 9.8 12/16/2020  ? EGFR 103 12/16/2020  ? GFRNONAA 113 12/11/2019  ? ?Lab Results  ?Component Value Date  ? CHOL 142 12/16/2020  ? HDL 57 12/16/2020  ? [place] 74 12/16/2020  ? TRIG 47 12/16/2020  ? CHOLHDL 2.5 12/16/2020  ? ?Lab Results  ?Component Value Date  ? TSH 1.350 12/16/2020  ? ?No results found for: HGBA1C ?Lab Results  ?Component Value Date  ? WBC 7.9 12/16/2020  ? HGB 12.8 12/16/2020  ? HCT 39.8 12/16/2020  ? MCV 81 12/16/2020  ? PLT 404 12/16/2020  ? ?Lab Results  ?Component Value Date  ? ALT 15 12/16/2020  ? AST 19 12/16/2020  ? ALKPHOS 89 12/16/2020  ? BILITOT 0.3 12/16/2020  ? ?No results found for: 25OHVITD2, [place], VD25OH  ? ?Review of Systems  ?Constitutional:  Negative for fatigue and unexpected weight change.  ?HENT:  Negative for nosebleeds.   ?Eyes:  Negative for visual disturbance.  ?Respiratory:  Negative for cough, chest tightness, shortness of breath and wheezing.   ?Cardiovascular:  Negative for chest pain, palpitations and leg swelling.  ?Gastrointestinal:  Negative for abdominal pain, constipation and diarrhea.  ?Neurological:  Negative for dizziness, weakness, light-headedness and headaches.  ?Psychiatric/Behavioral:  Negative for dysphoric mood and sleep disturbance. The patient is not nervous/anxious.    ? ?Patient Active Problem List  ? Diagnosis Date Noted  ? 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 Sleep pattern disturbance 04/25/2018  ? Essential hypertension 01/23/2018  ? Irregular menses 02/14/2016  ? ? ?Allergies  ?Allergen Reactions  ? Ace Inhibitors Cough  ? ? ?History reviewed. No pertinent surgical history. ? ?Social History  ? ?Tobacco Use  ? Smoking status: Former  ?  Types: Cigarettes  ?  Quit date: 09/15/2015  ?  Years since quitting: 5.7  ? Smokeless tobacco: Never  ?Substance Use Topics  ? Alcohol use: Yes  ?  Comment: weekend only mixed drinks   ? Drug use: No  ? ? ? ?Medication list has been reviewed and updated. ? ?Current Meds  ?Medication Sig  ? amLODipine (NORVASC) 5 MG tablet Take 1 tablet (5 mg total) by mouth daily.  ? cyclobenzaprine (FLEXERIL) 5 MG tablet Take 1 tablet (5 mg total) by mouth 2 (two) times daily as needed for muscle spasms. (Patient taking differently: Take 5 mg by mouth 2 (two) times daily as needed for muscle spasms.)  ? irbesartan-hydrochlorothiazide (AVALIDE) 150-12.5 MG tablet Take 2 tablets by mouth daily.  ? levonorgestrel (MIRENA) 20 MCG/DAY IUD 1 each by Intrauterine route once.  ? temazepam (RESTORIL) 15 MG capsule Take 1 capsule (15 mg total) by mouth at bedtime as needed for sleep.  ? [DISCONTINUED] tranexamic acid (LYSTEDA) 650 MG TABS tablet Take by mouth. Once a month  for heavy menstrual bleeding  ? ? ?PHQ 2/9 Scores 06/15/2021 12/16/2020 07/07/2020 01/22/2020  ?PHQ - 2 Score 1 0 0 0  ?PHQ- 9 Score _0 ? ? ?GAD 7 : Generalized Anxiety Score 12/16/2020 07/07/2020 01/22/2020 12/11/2019  ?Nervous, Anxious, on Edge 1 0 2 1  ?Control/stop worrying 1 0 2 1  ?Worry too much - different things 1 0 2 1  ?Trouble relaxing 0 0 0 1  ?Restless 0 0 0 0  ?Easily annoyed or irritable 0 0 1 1  ?Afraid - awful might happen 0 0 0 1  ?Total GAD 7 Score 3 0 7 6  ?Anxiety Difficulty - Not difficult at all Not difficult at all Somewhat difficult   ? ? ?BP Readings from Last 3 Encounters:  ?06/15/21 118/84  ?12/16/20 126/86  ?07/07/20 124/78  ? ? ?Physical Exam ?Vitals and nursing note reviewed.  ?Constitutional:   ?   General: She is not in acute distress. ?   Appearance: She is well-developed.  ?HENT:  ?   Head: Normocephalic and atraumatic.  ?Cardiovascular:  ?   Rate and Rhythm: Normal rate and regular rhythm.  ?   Heart sounds: No murmur heard. ?Pulmonary:  ?   Effort: Pulmonary effort is normal. No respiratory distress.  ?   Breath sounds: No wheezing or rhonchi.  ?Musculoskeletal:  ?   Cervical back: Normal range of motion.  ?   Right lower leg: No edema.  ?   Left lower leg: No edema.  ?Lymphadenopathy:  ?   Cervical: No cervical adenopathy.  ?Skin: ?   General: Skin is warm and dry.  ?   Capillary Refill: Capillary refill takes less than 2 seconds.  ?   Findings: No rash.  ?Neurological:  ?   General: No focal deficit present.  ?   Mental Status: She is alert and oriented to person, place, and time.  ?Psychiatric:     ?   Mood and Affect: Mood normal.     ?   Behavior: Behavior normal.  ? ? ?Wt Readings from Last 3 Encounters:  ?06/15/21 221 lb (100.2 kg)  ?12/16/20 220 lb (99.8 kg)  ?07/07/20 216 lb (98 kg)  ? ? ?BP 118/84   Pulse 99   Ht _1  (1.626 m)   Wt 221 lb (100.2 kg)   SpO2 96%   BMI 37.93 kg/m?  ? ?Assessment and Plan: ?1. Essential hypertension ?Clinically stable exam with well controlled BP. ?Tolerating medications without side effects at this time. ?Pt to continue current regimen and low sodium diet; benefits of regular exercise as able discussed. ? ? ?Partially dictated using Editor, commissioning. Any errors are unintentional. ? ?Halina Maidens, MD ?[facility] ?[facility] ? ?06/15/2021 ? ? ? ? ? ?

## 2021-06-19 DIAGNOSIS — R300 Dysuria: Secondary | ICD-10-CM | POA: Diagnosis not present

## 2021-07-18 ENCOUNTER — Other Ambulatory Visit: Payer: Self-pay

## 2021-08-31 DIAGNOSIS — Z113 Encounter for screening for infections with a predominantly sexual mode of transmission: Secondary | ICD-10-CM | POA: Diagnosis not present

## 2021-08-31 DIAGNOSIS — Z114 Encounter for screening for human immunodeficiency virus [HIV]: Secondary | ICD-10-CM | POA: Diagnosis not present

## 2021-08-31 DIAGNOSIS — N760 Acute vaginitis: Secondary | ICD-10-CM | POA: Diagnosis not present

## 2021-08-31 DIAGNOSIS — B9689 Other specified bacterial agents as the cause of diseases classified elsewhere: Secondary | ICD-10-CM | POA: Diagnosis not present

## 2021-08-31 DIAGNOSIS — N898 Other specified noninflammatory disorders of vagina: Secondary | ICD-10-CM | POA: Diagnosis not present

## 2021-12-20 ENCOUNTER — Encounter: Payer: Self-pay | Admitting: Internal Medicine

## 2021-12-20 ENCOUNTER — Ambulatory Visit (INDEPENDENT_AMBULATORY_CARE_PROVIDER_SITE_OTHER): Payer: BC Managed Care – PPO | Admitting: Internal Medicine

## 2021-12-20 VITALS — BP 128/88 | HR 94 | Ht 64.0 in | Wt 239.0 lb

## 2021-12-20 DIAGNOSIS — I10 Essential (primary) hypertension: Secondary | ICD-10-CM

## 2021-12-20 DIAGNOSIS — Z0000 Encounter for general adult medical examination without abnormal findings: Secondary | ICD-10-CM

## 2021-12-20 DIAGNOSIS — F5101 Primary insomnia: Secondary | ICD-10-CM

## 2021-12-20 DIAGNOSIS — Z131 Encounter for screening for diabetes mellitus: Secondary | ICD-10-CM | POA: Diagnosis not present

## 2021-12-20 DIAGNOSIS — Z13220 Encounter for screening for lipoid disorders: Secondary | ICD-10-CM | POA: Diagnosis not present

## 2021-12-20 MED ORDER — TEMAZEPAM 15 MG PO CAPS: 15.0000 mg | ORAL_CAPSULE | Freq: Every evening | ORAL | 0 refills | Status: DC | PRN

## 2021-12-20 MED ORDER — AMLODIPINE BESYLATE 5 MG PO TABS: 5.0000 mg | ORAL_TABLET | Freq: Every day | ORAL | 3 refills | Status: AC

## 2021-12-20 NOTE — Progress Notes (Signed)
Date:  12/20/2021   Name:  Brandi Cole   DOB:  1985/05/01   MRN:  [id number]   Chief Complaint: Annual Exam (Breat exam no pap) Brandi Cole is a 36 y.o. female who presents today for her Complete Annual Exam. She feels well. She reports exercising cardio 2-3 times a week, strength training 2-3 times a week. She reports she is sleeping poorly. Breast complaints none She continues to work for [employer] remotely.  Pap smear: 01/2018 neg/neg with GYN Colonoscopy: not due  Health Maintenance Due  Topic Date Due   TETANUS/TDAP  Never done    Immunization History  Administered Date(s) Administered   PPD Test 11/27/2019    Hypertension This is a chronic problem. The problem is controlled (129-139/80). Pertinent negatives include no chest pain, headaches, palpitations or shortness of breath. Past treatments include calcium channel blockers, diuretics and angiotensin blockers. The current treatment provides significant improvement. There are no compliance problems.  There is no history of kidney disease, CAD/MI or CVA.  Insomnia Primary symptoms: no sleep disturbance.   The problem occurs nightly. The problem is unchanged. Past treatments include medication. The treatment provided moderate relief.    Lab Results  Component Value Date   NA 139 12/16/2020   K 4.1 12/16/2020   CO2 24 12/16/2020   GLUCOSE 85 12/16/2020   BUN 11 12/16/2020   CREATININE 0.78 12/16/2020   CALCIUM 9.8 12/16/2020   EGFR 103 12/16/2020   GFRNONAA 113 12/11/2019   Lab Results  Component Value Date   CHOL 142 12/16/2020   HDL 57 12/16/2020   LDLCALC 74 12/16/2020   TRIG 47 12/16/2020   CHOLHDL 2.5 12/16/2020   Lab Results  Component Value Date   TSH 1.350 12/16/2020   No results found for: "HGBA1C" Lab Results  Component Value Date   WBC 7.9 12/16/2020   HGB 12.8 12/16/2020   HCT 39.8 12/16/2020   MCV 81 12/16/2020   PLT 404 12/16/2020   Lab Results  Component Value Date   ALT 15  12/16/2020   AST 19 12/16/2020   ALKPHOS 89 12/16/2020   BILITOT 0.3 12/16/2020   No results found for: "25OHVITD2", "25OHVITD3", "VD25OH"   Review of Systems  Constitutional:  Negative for chills, fatigue and fever.  HENT:  Negative for congestion, hearing loss, tinnitus, trouble swallowing and voice change.   Eyes:  Negative for visual disturbance.  Respiratory:  Negative for cough, chest tightness, shortness of breath and wheezing.   Cardiovascular:  Negative for chest pain, palpitations and leg swelling.  Gastrointestinal:  Negative for abdominal pain, constipation, diarrhea and vomiting.  Endocrine: Negative for polydipsia and polyuria.  Genitourinary:  Negative for dysuria, frequency, genital sores, vaginal bleeding and vaginal discharge.  Musculoskeletal:  Negative for arthralgias, gait problem and joint swelling.  Skin:  Negative for color change and rash.  Neurological:  Negative for dizziness, tremors, light-headedness and headaches.  Hematological:  Negative for adenopathy. Does not bruise/bleed easily.  Psychiatric/Behavioral:  Negative for dysphoric mood and sleep disturbance. The patient has insomnia. The patient is not nervous/anxious.     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 No pertinent surgical history.  Social History   Tobacco  Use   Smoking status: Former    Types: Cigarettes    Quit date: 09/15/2015    Years since quitting: 6.2   Smokeless tobacco: Never  Substance Use Topics   Alcohol use: Yes    Comment: weekend only mixed drinks    Drug use: No     Medication list has been reviewed and updated.  Current Meds  Medication Sig   amLODipine (NORVASC) 5 MG tablet Take 1  tablet (5 mg total) by mouth daily.   cyclobenzaprine (FLEXERIL) 5 MG tablet Take 1 tablet (5 mg total) by mouth 2 (two) times daily as needed for muscle spasms. (Patient taking differently: Take 5 mg by mouth 2 (two) times daily as needed for muscle spasms.)   irbesartan-hydrochlorothiazide (AVALIDE) 150-12.5 MG tablet Take 2 tablets by mouth daily.   levonorgestrel (MIRENA) 20 MCG/DAY IUD 1 each by Intrauterine route once.   temazepam (RESTORIL) 15 MG capsule Take 1 capsule (15 mg total) by mouth at bedtime as needed for sleep.       12/20/2021    9:23 AM 06/15/2021    8:08 AM 12/16/2020    9:26 AM 07/07/2020    9:24 AM  GAD 7 : Generalized Anxiety Score  Nervous, Anxious, on Edge 3 2 1  0  Control/stop worrying 1 2 1  0  Worry too much - different things 1 1 1  0  Trouble relaxing 0 1 0 0  Restless 1 0 0 0  Easily annoyed or irritable 0 0 0 0  Afraid - awful might happen 0 0 0 0  Total GAD 7 Score 6 6 3  0  Anxiety Difficulty Not difficult at all   Not difficult at all       12/20/2021    9:23 AM 06/15/2021    8:07 AM 12/16/2020    9:26 AM  Depression screen PHQ 2/9  Decreased Interest 0 0 0  Down, Depressed, Hopeless 1 1 0  PHQ - 2 Score 1 1 0  Altered sleeping 2 1 2   Tired, decreased energy 1 1 1   Change in appetite 1 1 1   Feeling bad or failure about yourself  0 0 0  Trouble concentrating 0 0 0  Moving slowly or fidgety/restless 0 0 0  Suicidal thoughts 0 0 0  PHQ-9 Score 5 4 4   Difficult doing work/chores Somewhat difficult Not difficult at all Not difficult at all    BP Readings from Last 3 Encounters:  12/20/21 128/88  06/15/21 118/84  12/16/20 126/86    Physical Exam Vitals and nursing note reviewed.  Constitutional:      General: She is not in acute distress.    Appearance: She is well-developed.  HENT:     Head: Normocephalic and atraumatic.     Right Ear: Tympanic membrane and ear canal normal.     Left Ear: Tympanic membrane and ear canal normal.     Nose:      Right Sinus: No maxillary sinus tenderness.     Left Sinus: No maxillary sinus tenderness.  Eyes:     General: No scleral icterus.       Right eye: No discharge.        Left eye: No discharge.     Conjunctiva/sclera: Conjunctivae normal.  Neck:     Thyroid: No thyromegaly.     Vascular: No carotid bruit.  Cardiovascular:     Rate and Rhythm: Normal rate and regular rhythm.     Pulses: Normal pulses.  Heart sounds: Normal heart sounds.  Pulmonary:     Effort: Pulmonary effort is normal. No respiratory distress.     Breath sounds: No wheezing.  Chest:  Breasts:    Right: No mass, nipple discharge, skin change or tenderness.     Left: No mass, nipple discharge, skin change or tenderness.  Abdominal:     General: Bowel sounds are normal.     Palpations: Abdomen is soft.     Tenderness: There is no abdominal tenderness.  Musculoskeletal:     Cervical back: Normal range of motion. No erythema.     Right lower leg: No edema.     Left lower leg: No edema.  Lymphadenopathy:     Cervical: No cervical adenopathy.  Skin:    General: Skin is warm and dry.     Findings: No rash.  Neurological:     Mental Status: She is alert and oriented to person, place, and time.     Cranial Nerves: No cranial nerve deficit.     Sensory: No sensory deficit.     Deep Tendon Reflexes: Reflexes are normal and symmetric.  Psychiatric:        Attention and Perception: Attention normal.        Mood and Affect: Mood normal.     Wt Readings from Last 3 Encounters:  12/20/21 239 lb (108.4 kg)  06/15/21 221 lb (100.2 kg)  12/16/20 220 lb (99.8 kg)    BP 128/88   Pulse 94   Ht 5' 4"  (1.626 m)   Wt 239 lb (108.4 kg)   BMI 41.02 kg/m   Assessment and Plan: 1. Annual physical exam Normal exam except for weight Continue exercise and healthy diet  2. Screening for diabetes mellitus - Hemoglobin A1c  3. Screening for lipid disorders - Lipid panel  4. Essential hypertension Clinically  stable exam with well controlled BP. Tolerating medications without side effects at this time. Pt to continue current regimen and low sodium diet; benefits of regular exercise as able discussed. - CBC with Differential/Platelet - Comprehensive metabolic panel - TSH - Urinalysis, Routine w reflex microscopic - amLODipine (NORVASC) 5 MG tablet; Take 1 tablet (5 mg total) by mouth daily.  Dispense: 90 tablet; Refill: 3  5. Primary insomnia Continue medications PRN - temazepam (RESTORIL) 15 MG capsule; Take 1 capsule (15 mg total) by mouth at bedtime as needed for sleep.  Dispense: 30 capsule; Refill: 0   Partially dictated using Editor, commissioning. Any errors are unintentional.  Halina Maidens, MD [facility]  12/20/2021

## 2021-12-27 ENCOUNTER — Telehealth: Payer: Self-pay

## 2021-12-27 NOTE — Telephone Encounter (Signed)
Called pt left VM to get labs done.  KP

## 2021-12-28 DIAGNOSIS — I10 Essential (primary) hypertension: Secondary | ICD-10-CM | POA: Diagnosis not present

## 2021-12-28 DIAGNOSIS — Z131 Encounter for screening for diabetes mellitus: Secondary | ICD-10-CM | POA: Diagnosis not present

## 2021-12-28 DIAGNOSIS — Z13220 Encounter for screening for lipoid disorders: Secondary | ICD-10-CM | POA: Diagnosis not present

## 2021-12-28 NOTE — Telephone Encounter (Signed)
Called, left vm to call back in regards to lab work.

## 2021-12-29 LAB — URINALYSIS, ROUTINE W REFLEX MICROSCOPIC
Bilirubin, UA: NEGATIVE
Glucose, UA: NEGATIVE
Nitrite, UA: NEGATIVE
RBC, UA: NEGATIVE
Specific Gravity, UA: >=1.03 — AB (ref 1.005–1.030)
Urobilinogen, Ur: 1 mg/dL (ref 0.2–1.0)
pH, UA: 5.5 (ref 5.0–7.5)

## 2021-12-29 LAB — CBC WITH DIFFERENTIAL/PLATELET
Basophils Absolute: 0.1 10*3/uL (ref 0.0–0.2)
Basos: 1 %
EOS (ABSOLUTE): 0.1 10*3/uL (ref 0.0–0.4)
Eos: 2 %
Hematocrit: 38.9 % (ref 34.0–46.6)
Hemoglobin: 12.8 g/dL (ref 11.1–15.9)
Immature Grans (Abs): 0 10*3/uL (ref 0.0–0.1)
Immature Granulocytes: 1 %
Lymphocytes Absolute: 2.3 10*3/uL (ref 0.7–3.1)
Lymphs: 29 %
MCH: 26.2 pg — ABNORMAL LOW (ref 26.6–33.0)
MCHC: 32.9 g/dL (ref 31.5–35.7)
MCV: 80 fL (ref 79–97)
Monocytes Absolute: 0.7 10*3/uL (ref 0.1–0.9)
Monocytes: 9 %
Neutrophils Absolute: 4.7 10*3/uL (ref 1.4–7.0)
Neutrophils: 58 %
Platelets: 400 10*3/uL (ref 150–450)
RBC: 4.88 x10E6/uL (ref 3.77–5.28)
RDW: 12.7 % (ref 11.7–15.4)
WBC: 7.9 10*3/uL (ref 3.4–10.8)

## 2021-12-29 LAB — COMPREHENSIVE METABOLIC PANEL
ALT: 20 IU/L (ref 0–32)
AST: 16 IU/L (ref 0–40)
Albumin/Globulin Ratio: 1.6 (ref 1.2–2.2)
Albumin: 4.4 g/dL (ref 3.9–4.9)
Alkaline Phosphatase: 71 IU/L (ref 44–121)
BUN/Creatinine Ratio: 10 (ref 9–23)
BUN: 9 mg/dL (ref 6–20)
Bilirubin Total: 0.4 mg/dL (ref 0.0–1.2)
CO2: 24 mmol/L (ref 20–29)
Calcium: 9.6 mg/dL (ref 8.7–10.2)
Chloride: 101 mmol/L (ref 96–106)
Creatinine, Ser: 0.86 mg/dL (ref 0.57–1.00)
Globulin, Total: 2.8 g/dL (ref 1.5–4.5)
Glucose: 90 mg/dL (ref 70–99)
Potassium: 4 mmol/L (ref 3.5–5.2)
Sodium: 142 mmol/L (ref 134–144)
Total Protein: 7.2 g/dL (ref 6.0–8.5)
eGFR: 91 mL/min/{1.73_m2} (ref >59)

## 2021-12-29 LAB — LIPID PANEL
Chol/HDL Ratio: 3 ratio (ref 0.0–4.4)
Cholesterol, Total: 126 mg/dL (ref 100–199)
HDL: 42 mg/dL (ref >39)
LDL Chol Calc (NIH): 66 mg/dL (ref 0–99)
Triglycerides: 94 mg/dL (ref 0–149)
VLDL Cholesterol Cal: 18 mg/dL (ref 5–40)

## 2021-12-29 LAB — MICROSCOPIC EXAMINATION
Casts: NONE SEEN /lpf
Epithelial Cells (non renal): >10 /hpf — AB (ref 0–10)

## 2021-12-29 LAB — TSH: TSH: 2.36 u[IU]/mL (ref 0.450–4.500)

## 2021-12-29 LAB — HEMOGLOBIN A1C
Est. average glucose Bld gHb Est-mCnc: 120 mg/dL
Hgb A1c MFr Bld: 5.8 % — ABNORMAL HIGH (ref 4.8–5.6)

## 2022-02-21 ENCOUNTER — Encounter: Payer: Self-pay | Admitting: Internal Medicine

## 2022-02-21 ENCOUNTER — Other Ambulatory Visit: Payer: Self-pay | Admitting: Internal Medicine

## 2022-02-21 DIAGNOSIS — R300 Dysuria: Secondary | ICD-10-CM

## 2022-02-21 DIAGNOSIS — I10 Essential (primary) hypertension: Secondary | ICD-10-CM

## 2022-02-21 MED ORDER — SULFAMETHOXAZOLE-TRIMETHOPRIM 800-160 MG PO TABS: 1.0000 | ORAL_TABLET | Freq: Two times a day (BID) | ORAL | 0 refills | Status: AC

## 2022-02-21 NOTE — Telephone Encounter (Signed)
Please review.  KP

## 2022-02-21 NOTE — Telephone Encounter (Signed)
Requested medication (s) are due for refill today: expired medication  Requested medication (s) are on the active medication list: yes  Last refill:  12/16/20 #180 3 refills  Future visit scheduled: yes in 4 months  Notes to clinic:  expired medication. Do you want to renew Rx?     Requested Prescriptions  Pending Prescriptions Disp Refills   irbesartan-hydrochlorothiazide (AVALIDE) 150-12.5 MG tablet [Pharmacy Med Name: IRBESARTAN/HCTZ 150-12.5MG TABLETS] 180 tablet 3    Sig: Take 2 tablets by mouth daily.     Cardiovascular: ARB + Diuretic Combos Passed - 02/21/2022  4:57 PM      Passed - K in normal range and within 180 days    Potassium  Date Value Ref Range Status  12/28/2021 4.0 3.5 - 5.2 mmol/L Final  04/28/2014 3.4 (L) 3.5 - 5.1 mmol/L Final         Passed - Na in normal range and within 180 days    Sodium  Date Value Ref Range Status  12/28/2021 142 134 - 144 mmol/L Final  04/28/2014 138 136 - 145 mmol/L Final         Passed - Cr in normal range and within 180 days    Creatinine  Date Value Ref Range Status  04/28/2014 1.01 0.60 - 1.30 mg/dL Final   Creatinine, Ser  Date Value Ref Range Status  12/28/2021 0.86 0.57 - 1.00 mg/dL Final         Passed - eGFR is 10 or above and within 180 days    EGFR (African American)  Date Value Ref Range Status  04/28/2014 >60 >6m/min Final   GFR calc Af Amer  Date Value Ref Range Status  12/11/2019 130 >59 mL/min/1.73 Final    Comment:    **[facility] currently reports eGFR in compliance with the current**   recommendations of the [employer] [facility] will   update reporting as new guidelines are published from the NKF-ASN   Task force.    EGFR (Non-African Amer.)  Date Value Ref Range Status  04/28/2014 >60 >659mmin Final    Comment:    eGFR values <6086min/1.73 m2 may be an indication of chronic kidney disease (CKD). Calculated eGFR, using the MRDR Study equation, is useful in  patients with  stable renal function. The eGFR calculation will not be reliable in acutely ill patients when serum creatinine is changing rapidly. It is not useful in patients on dialysis. The eGFR calculation may not be applicable to patients at the low and high extremes of body sizes, pregnant women, and vegetarians.    GFR calc non Af Amer  Date Value Ref Range Status  12/11/2019 113 >59 mL/min/1.73 Final   eGFR  Date Value Ref Range Status  12/28/2021 91 >59 mL/min/1.73 Final         Passed - Patient is not pregnant      Passed - Last BP in normal range    BP Readings from Last 1 Encounters:  12/20/21 128/88         Passed - Valid encounter within last 6 months    Recent Outpatient Visits           2 months ago Annual physical exam   [place] [facility]   8 months ago Essential hypertension   [place] [facility]   1 year ago Annual physical exam   [facility], Jesse Sans, MD   1 year ago Essential hypertension   [place] [facility] at [facility], Jesse Sans, MD   2 years ago Essential hypertension   [place] [facility] at [facility], Jesse Sans, MD       Future Appointments             In 4 months Army Melia, Jesse Sans, MD [facility] at [facility], [facility]   In 10 months Army Melia, Jesse Sans, MD [place] [facility] at [facility], [facility]

## 2022-04-20 DIAGNOSIS — N760 Acute vaginitis: Secondary | ICD-10-CM | POA: Diagnosis not present

## 2022-04-20 DIAGNOSIS — Z114 Encounter for screening for human immunodeficiency virus [HIV]: Secondary | ICD-10-CM | POA: Diagnosis not present

## 2022-04-20 DIAGNOSIS — Z113 Encounter for screening for infections with a predominantly sexual mode of transmission: Secondary | ICD-10-CM | POA: Diagnosis not present

## 2022-04-20 DIAGNOSIS — B9689 Other specified bacterial agents as the cause of diseases classified elsewhere: Secondary | ICD-10-CM | POA: Diagnosis not present

## 2022-04-20 DIAGNOSIS — N930 Postcoital and contact bleeding: Secondary | ICD-10-CM | POA: Diagnosis not present

## 2022-05-22 DIAGNOSIS — F322 Major depressive disorder, single episode, severe without psychotic features: Secondary | ICD-10-CM | POA: Diagnosis not present

## 2022-05-31 ENCOUNTER — Other Ambulatory Visit: Payer: Self-pay | Admitting: Internal Medicine

## 2022-05-31 DIAGNOSIS — I10 Essential (primary) hypertension: Secondary | ICD-10-CM

## 2022-06-09 DIAGNOSIS — M25512 Pain in left shoulder: Secondary | ICD-10-CM | POA: Diagnosis not present

## 2022-06-20 DIAGNOSIS — F411 Generalized anxiety disorder: Secondary | ICD-10-CM | POA: Diagnosis not present

## 2022-06-21 ENCOUNTER — Encounter: Payer: Self-pay | Admitting: Internal Medicine

## 2022-06-21 ENCOUNTER — Ambulatory Visit: Payer: BC Managed Care – PPO | Admitting: Internal Medicine

## 2022-06-21 VITALS — BP 124/84 | HR 96 | Ht 64.0 in | Wt 229.0 lb

## 2022-06-21 DIAGNOSIS — I10 Essential (primary) hypertension: Secondary | ICD-10-CM

## 2022-06-21 DIAGNOSIS — M7502 Adhesive capsulitis of left shoulder: Secondary | ICD-10-CM

## 2022-06-21 DIAGNOSIS — R7303 Prediabetes: Secondary | ICD-10-CM

## 2022-06-21 DIAGNOSIS — F322 Major depressive disorder, single episode, severe without psychotic features: Secondary | ICD-10-CM | POA: Diagnosis not present

## 2022-06-21 LAB — POCT GLYCOSYLATED HEMOGLOBIN (HGB A1C): Hemoglobin A1C: 6.6 % — AB (ref 4.0–5.6)

## 2022-06-21 NOTE — Assessment & Plan Note (Signed)
Doing well with diet and weight loss A1C slightly higher for unclear reasons - possibly the recent steroid injection Continue diet only Recheck next visit

## 2022-06-21 NOTE — Progress Notes (Signed)
Date:  06/21/2022   Name:  Brandi Cole   DOB:  [birth year]   MRN:  [id number]   Chief Complaint: Hypertension, Prediabetes, and Shoulder Pain (Left shoulder )  Hypertension This is a chronic problem. The problem is controlled (114/70). Pertinent negatives include no chest pain, headaches, palpitations or shortness of breath. Past treatments include calcium channel blockers, angiotensin blockers and diuretics. The current treatment provides significant improvement.  Shoulder Pain  This is a recurrent (hx of frozen shoulder three years ago) problem. The problem occurs constantly. The problem has been unchanged. The quality of the pain is described as aching. The pain is at a severity of 4/10. The pain is mild.  Diabetes She presents for her follow-up diabetic visit. Diabetes type: prediabetes. Pertinent negatives for hypoglycemia include no dizziness, headaches or nervousness/anxiousness. Pertinent negatives for diabetes include no chest pain, no fatigue and no weakness. Current diabetic treatment includes diet. Weight trend: has lost 10 lbs.    Lab Results  Component Value Date   NA 142 12/28/2021   K 4.0 12/28/2021   CO2 24 12/28/2021   GLUCOSE 90 12/28/2021   BUN 9 12/28/2021   CREATININE 0.86 12/28/2021   CALCIUM 9.6 12/28/2021   EGFR 91 12/28/2021   GFRNONAA 113 12/11/2019   Lab Results  Component Value Date   CHOL 126 12/28/2021   HDL 42 12/28/2021   LDLCALC 66 12/28/2021   TRIG 94 12/28/2021   CHOLHDL 3.0 12/28/2021   Lab Results  Component Value Date   TSH 2.360 12/28/2021   Lab Results  Component Value Date   HGBA1C 6.6 (A) 06/21/2022   Lab Results  Component Value Date   WBC 7.9 12/28/2021   HGB 12.8 12/28/2021   HCT 38.9 12/28/2021   MCV 80 12/28/2021   PLT 400 12/28/2021   Lab Results  Component Value Date   ALT 20 12/28/2021   AST 16 12/28/2021   ALKPHOS 71 12/28/2021   BILITOT 0.4 12/28/2021   No results found for: "25OHVITD2", "25OHVITD3",  "VD25OH"   Review of Systems  Constitutional:  Negative for fatigue and unexpected weight change.  HENT:  Negative for nosebleeds.   Eyes:  Negative for visual disturbance.  Respiratory:  Negative for cough, chest tightness, shortness of breath and wheezing.   Cardiovascular:  Negative for chest pain, palpitations and leg swelling.  Gastrointestinal:  Negative for abdominal pain, constipation and diarrhea.  Musculoskeletal:  Positive for arthralgias (left shoulder).  Neurological:  Negative for dizziness, weakness, light-headedness and headaches.  Psychiatric/Behavioral:  Negative for dysphoric mood and sleep disturbance. The patient is not nervous/anxious.     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 No pertinent surgical history.  Social History   Tobacco Use   Smoking status: Former    Types: Cigarettes    Quit date: 09/15/2015    Years since quitting: 6.7   Smokeless tobacco: Never  Substance Use Topics   Alcohol use: Yes    Comment: weekend only mixed drinks    Drug use: No     Medication list has been reviewed and updated.  Current Meds  Medication Sig   amLODipine (NORVASC) 5 MG tablet Take 1 tablet (  5 mg total) by mouth daily.   cyclobenzaprine (FLEXERIL) 5 MG tablet Take 1 tablet (5 mg total) by mouth 2 (two) times daily as needed for muscle spasms. (Patient taking differently: Take 5 mg by mouth 2 (two) times daily as needed for muscle spasms.)   hydrOXYzine (ATARAX) 10 MG tablet TAKE 1 TABLET BY MOUTH THREE TIMES DAILY FOR ANXIETY   irbesartan-hydrochlorothiazide (AVALIDE) 150-12.5 MG tablet TAKE 2 TABLETS BY MOUTH DAILY   levonorgestrel (MIRENA) 20 MCG/DAY IUD  1 each by Intrauterine route once.   sertraline (ZOLOFT) 50 MG tablet    temazepam (RESTORIL) 15 MG capsule Take 1 capsule (15 mg total) by mouth at bedtime as needed for sleep.   traZODone (DESYREL) 50 MG tablet Take 1 tablet by mouth at bedtime as needed.       06/21/2022    8:06 AM 12/20/2021    9:23 AM 06/15/2021    8:08 AM 12/16/2020    9:26 AM  GAD 7 : Generalized Anxiety Score  Nervous, Anxious, on Edge '1 3 2 1  '$ Control/stop worrying 0 '1 2 1  '$ Worry too much - different things '1 1 1 1  '$ Trouble relaxing 0 0 1 0  Restless 1 1 0 0  Easily annoyed or irritable 0 0 0 0  Afraid - awful might happen 0 0 0 0  Total GAD 7 Score '3 6 6 3  '$ Anxiety Difficulty Somewhat difficult Not difficult at all         06/21/2022    8:06 AM 12/20/2021    9:23 AM 06/15/2021    8:07 AM  Depression screen PHQ 2/9  Decreased Interest 1 0 0  Down, Depressed, Hopeless 0 1 1  PHQ - 2 Score '1 1 1  '$ Altered sleeping '2 2 1  '$ Tired, decreased energy '1 1 1  '$ Change in appetite 0 1 1  Feeling bad or failure about yourself  0 0 0  Trouble concentrating 0 0 0  Moving slowly or fidgety/restless 0 0 0  Suicidal thoughts  0 0  PHQ-9 Score '4 5 4  '$ Difficult doing work/chores Somewhat difficult Somewhat difficult Not difficult at all    BP Readings from Last 3 Encounters:  06/21/22 124/84  12/20/21 128/88  06/15/21 118/84    Physical Exam Vitals and nursing note reviewed.  Constitutional:      General: She is not in acute distress.    Appearance: Normal appearance. She is well-developed.  HENT:     Head: Normocephalic and atraumatic.  Cardiovascular:     Rate and Rhythm: Normal rate and regular rhythm.     Heart sounds: No murmur heard. Pulmonary:     Effort: Pulmonary effort is normal. No respiratory distress.     Breath sounds: No wheezing or rhonchi.  Musculoskeletal:     Left shoulder: Tenderness present. No swelling or deformity. Normal range of motion.     Cervical back: Normal range of motion.      Right lower leg: No edema.     Left lower leg: No edema.  Lymphadenopathy:     Cervical: No cervical adenopathy.  Skin:    General: Skin is warm and dry.     Findings: No rash.  Neurological:     Mental Status: She is alert and oriented to person, place, and time.  Psychiatric:        Mood and Affect: Mood normal.        Behavior: Behavior normal.     Wt  Readings from Last 3 Encounters:  06/21/22 229 lb (103.9 kg)  12/20/21 239 lb (108.4 kg)  06/15/21 221 lb (100.2 kg)    BP 124/84 (BP Location: Right Arm, Cuff Size: Large)   Pulse 96   Ht '5\' 4"'$  (1.626 m)   Wt 229 lb (103.9 kg)   SpO2 98%   BMI 39.31 kg/m   Assessment and Plan: Problem List Items Addressed This Visit       Cardiovascular and Mediastinum   Essential hypertension - Primary (Chronic)    Clinically stable exam with well controlled BP on amlodipine, irbesartan hct. Tolerating medications without side effects. Pt to continue current regimen and low sodium diet.         Musculoskeletal and Integument   Adhesive capsulitis of left shoulder    Recent Ortho evaluation and steroid injection Actually doing much better Consider PT as recommended if worsening        Other   Prediabetes    Doing well with diet and weight loss A1C slightly higher for unclear reasons - possibly the recent steroid injection Continue diet only Recheck next visit      Relevant Orders   POCT HgB A1C (Completed)     Partially dictated using Editor, commissioning. Any errors are unintentional.  Halina Maidens, MD [facility]  06/21/2022

## 2022-06-21 NOTE — Assessment & Plan Note (Signed)
Clinically stable exam with well controlled BP on amlodipine, irbesartan hct. Tolerating medications without side effects. Pt to continue current regimen and low sodium diet.

## 2022-06-21 NOTE — Assessment & Plan Note (Signed)
Recent Ortho evaluation and steroid injection Actually doing much better Consider PT as recommended if worsening

## 2022-07-05 DIAGNOSIS — M25512 Pain in left shoulder: Secondary | ICD-10-CM | POA: Diagnosis not present

## 2022-07-11 DIAGNOSIS — F411 Generalized anxiety disorder: Secondary | ICD-10-CM | POA: Diagnosis not present

## 2022-07-26 DIAGNOSIS — F5101 Primary insomnia: Secondary | ICD-10-CM | POA: Diagnosis not present

## 2022-07-26 DIAGNOSIS — F322 Major depressive disorder, single episode, severe without psychotic features: Secondary | ICD-10-CM | POA: Diagnosis not present

## 2022-07-26 DIAGNOSIS — F411 Generalized anxiety disorder: Secondary | ICD-10-CM | POA: Diagnosis not present

## 2022-09-18 ENCOUNTER — Other Ambulatory Visit: Payer: Self-pay | Admitting: Internal Medicine

## 2022-09-18 DIAGNOSIS — I10 Essential (primary) hypertension: Secondary | ICD-10-CM

## 2022-09-19 NOTE — Telephone Encounter (Signed)
Requested Prescriptions  Pending Prescriptions Disp Refills   irbesartan-hydrochlorothiazide (AVALIDE) 150-12.5 MG tablet [Pharmacy Med Name: IRBESARTAN/HCTZ 150-12.5MG  TABLETS] 180 tablet 0    Sig: TAKE 2 TABLETS BY MOUTH DAILY     Cardiovascular: ARB + Diuretic Combos Failed - 09/18/2022  9:13 PM      Failed - K in normal range and within 180 days    Potassium  Date Value Ref Range Status  12/28/2021 4.0 3.5 - 5.2 mmol/L Final  04/28/2014 3.4 (L) 3.5 - 5.1 mmol/L Final         Failed - Na in normal range and within 180 days    Sodium  Date Value Ref Range Status  12/28/2021 142 134 - 144 mmol/L Final  04/28/2014 138 136 - 145 mmol/L Final         Failed - Cr in normal range and within 180 days    Creatinine  Date Value Ref Range Status  04/28/2014 1.01 0.60 - 1.30 mg/dL Final   Creatinine, Ser  Date Value Ref Range Status  12/28/2021 0.86 0.57 - 1.00 mg/dL Final         Failed - eGFR is 10 or above and within 180 days    EGFR (African American)  Date Value Ref Range Status  04/28/2014 >60 >51mL/min Final   GFR calc Af Amer  Date Value Ref Range Status  12/11/2019 130 >59 mL/min/1.73 Final    Comment:    **[facility] currently reports eGFR in compliance with the current**   recommendations of the [employer]. [facility] will   update reporting as new guidelines are published from the NKF-ASN   Task force.    EGFR (Non-African Amer.)  Date Value Ref Range Status  04/28/2014 >60 >29mL/min Final    Comment:    eGFR values <34mL/min/1.73 m2 may be an indication of chronic kidney disease (CKD). Calculated eGFR, using the MRDR Study equation, is useful in  patients with stable renal function. The eGFR calculation will not be reliable in acutely ill patients when serum creatinine is changing rapidly. It is not useful in patients on dialysis. The eGFR calculation may not be applicable to patients at the low and high extremes of body sizes, pregnant women, and  vegetarians.    GFR calc non Af Amer  Date Value Ref Range Status  12/11/2019 113 >59 mL/min/1.73 Final   eGFR  Date Value Ref Range Status  12/28/2021 91 >59 mL/min/1.73 Final         Passed - Patient is not pregnant      Passed - Last BP in normal range    BP Readings from Last 1 Encounters:  06/21/22 124/84         Passed - Valid encounter within last 6 months    Recent Outpatient Visits           3 months ago Essential hypertension   [place] [facility] at [facility], Nyoka Cowden, MD   9 months ago Annual physical exam   [facility] at [facility], Nyoka Cowden, MD   1 year ago Essential hypertension   [place] [facility] at [facility], Nyoka Cowden, MD   1 year ago Annual physical exam   [facility] at [facility], Nyoka Cowden, MD   2 years ago Essential hypertension   [place] [facility] at [facility]  Charlann Boxer, MD       Future Appointments             In 3 months Judithann Graves Nyoka Cowden, MD [facility] at [facility], [facility]

## 2022-10-25 DIAGNOSIS — F411 Generalized anxiety disorder: Secondary | ICD-10-CM | POA: Diagnosis not present

## 2022-11-01 DIAGNOSIS — Z113 Encounter for screening for infections with a predominantly sexual mode of transmission: Secondary | ICD-10-CM | POA: Diagnosis not present

## 2022-11-01 DIAGNOSIS — B009 Herpesviral infection, unspecified: Secondary | ICD-10-CM | POA: Diagnosis not present

## 2022-11-01 DIAGNOSIS — Z114 Encounter for screening for human immunodeficiency virus [HIV]: Secondary | ICD-10-CM | POA: Diagnosis not present

## 2022-12-18 ENCOUNTER — Other Ambulatory Visit: Payer: Self-pay | Admitting: Internal Medicine

## 2022-12-18 DIAGNOSIS — I10 Essential (primary) hypertension: Secondary | ICD-10-CM

## 2022-12-26 ENCOUNTER — Encounter: Payer: Self-pay | Admitting: Internal Medicine

## 2022-12-27 ENCOUNTER — Ambulatory Visit (INDEPENDENT_AMBULATORY_CARE_PROVIDER_SITE_OTHER): Payer: BC Managed Care – PPO | Admitting: Internal Medicine

## 2022-12-27 ENCOUNTER — Encounter: Payer: Self-pay | Admitting: Internal Medicine

## 2022-12-27 VITALS — BP 122/74 | HR 83 | Ht 64.0 in | Wt 238.0 lb

## 2022-12-27 DIAGNOSIS — Z13220 Encounter for screening for lipoid disorders: Secondary | ICD-10-CM | POA: Diagnosis not present

## 2022-12-27 DIAGNOSIS — I10 Essential (primary) hypertension: Secondary | ICD-10-CM | POA: Diagnosis not present

## 2022-12-27 DIAGNOSIS — F5101 Primary insomnia: Secondary | ICD-10-CM

## 2022-12-27 DIAGNOSIS — Z0000 Encounter for general adult medical examination without abnormal findings: Secondary | ICD-10-CM | POA: Diagnosis not present

## 2022-12-27 DIAGNOSIS — R7303 Prediabetes: Secondary | ICD-10-CM | POA: Diagnosis not present

## 2022-12-27 LAB — POCT URINALYSIS DIPSTICK
Blood, UA: NEGATIVE
Glucose, UA: NEGATIVE
Ketones, UA: NEGATIVE
Leukocytes, UA: NEGATIVE
Nitrite, UA: NEGATIVE
Protein, UA: NEGATIVE
Spec Grav, UA: 1.01 (ref 1.010–1.025)
Urobilinogen, UA: 0.2 U/dL
pH, UA: 5 (ref 5.0–8.0)

## 2022-12-27 MED ORDER — TEMAZEPAM 15 MG PO CAPS: 15.0000 mg | ORAL_CAPSULE | Freq: Every evening | ORAL | 2 refills | Status: AC | PRN

## 2022-12-27 NOTE — Assessment & Plan Note (Signed)
Normal exam with stable BP on amlodipine, hydrochlorothiazide and Avapro. No concerns or side effects to current medication. No change in regimen; continue low sodium diet.

## 2022-12-27 NOTE — Progress Notes (Signed)
Date:  12/27/2022   Name:  Brandi Cole   DOB:  05/12/1985   MRN:  [id number]   Chief Complaint: Annual Exam Brandi Cole is a 37 y.o. female who presents today for her Complete Annual Exam. She feels well. She reports exercising some. She reports she is sleeping fairly well. Breast complaints none.  She is living with parents in [place] but moving to [place], [place] in November.  Mammogram: none DEXA: none Colonoscopy: none Pap: 03/2021 neg/neg  Health Maintenance Due  Topic Date Due   DTaP/Tdap/Td (1 - Tdap) Never done   COVID-19 Vaccine (1 - 2023-24 season) Never done    Immunization History  Administered Date(s) Administered   PPD Test 11/27/2019    Hypertension This is a chronic problem. The problem is controlled. Pertinent negatives include no chest pain, headaches, palpitations or shortness of breath. Past treatments include angiotensin blockers, calcium channel blockers and diuretics. The current treatment provides significant improvement.  Diabetes She presents for her follow-up diabetic visit. Diabetes type: prediabetes. Pertinent negatives for hypoglycemia include no dizziness, headaches, nervousness/anxiousness or tremors. Pertinent negatives for diabetes include no chest pain, no fatigue, no polydipsia and no polyuria. Current diabetic treatment includes diet.    Lab Results  Component Value Date   NA 142 12/28/2021   K 4.0 12/28/2021   CO2 24 12/28/2021   GLUCOSE 90 12/28/2021   BUN 9 12/28/2021   CREATININE 0.86 12/28/2021   CALCIUM 9.6 12/28/2021   EGFR 91 12/28/2021   GFRNONAA 113 12/11/2019   Lab Results  Component Value Date   CHOL 126 12/28/2021   HDL 42 12/28/2021   LDLCALC 66 12/28/2021   TRIG 94 12/28/2021   CHOLHDL 3.0 12/28/2021   Lab Results  Component Value Date   TSH 2.360 12/28/2021   Lab Results  Component Value Date   HGBA1C 6.6 (A) 06/21/2022   Lab Results  Component Value Date   WBC 7.9 12/28/2021   HGB 12.8 12/28/2021    HCT 38.9 12/28/2021   MCV 80 12/28/2021   PLT 400 12/28/2021   Lab Results  Component Value Date   ALT 20 12/28/2021   AST 16 12/28/2021   ALKPHOS 71 12/28/2021   BILITOT 0.4 12/28/2021   No results found for: "25OHVITD2", "25OHVITD3", "VD25OH"   Review of Systems  Constitutional:  Positive for unexpected weight change. Negative for chills, fatigue and fever.  HENT:  Negative for congestion, hearing loss, tinnitus, trouble swallowing and voice change.   Eyes:  Negative for visual disturbance.  Respiratory:  Negative for cough, chest tightness, shortness of breath and wheezing.   Cardiovascular:  Negative for chest pain, palpitations and leg swelling.  Gastrointestinal:  Positive for constipation. Negative for abdominal pain, diarrhea and vomiting.  Endocrine: Negative for polydipsia and polyuria.  Genitourinary:  Negative for dysuria, frequency, genital sores, vaginal bleeding and vaginal discharge.  Musculoskeletal:  Negative for arthralgias, gait problem and joint swelling.  Skin:  Negative for color change and rash.  Neurological:  Negative for dizziness, tremors, light-headedness and headaches.  Hematological:  Negative for adenopathy. Does not bruise/bleed easily.  Psychiatric/Behavioral:  Positive for sleep disturbance. Negative for dysphoric mood. The patient is not nervous/anxious.     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 No pertinent surgical history.  Social History   Tobacco Use   Smoking status: Former    Current packs/day: 0.00    Types: Cigarettes    Quit date: 09/15/2015    Years since quitting: 7.2   Smokeless tobacco: Never  Substance Use Topics   Alcohol use: Yes    Comment: weekend only  mixed drinks    Drug use: No     Medication list has been reviewed and updated.  Current Meds  Medication Sig   amLODipine (NORVASC) 5 MG tablet Take 1 tablet (5 mg total) by mouth daily.   cyclobenzaprine (FLEXERIL) 5 MG tablet Take 1 tablet (5 mg total) by mouth 2 (two) times daily as needed for muscle spasms. (Patient taking differently: Take 5 mg by mouth 2 (two) times daily as needed for muscle spasms.)   gabapentin (NEURONTIN) 300 MG capsule Take 1 capsule by mouth at bedtime as needed.   hydrOXYzine (ATARAX) 10 MG tablet TAKE 1 TABLET BY MOUTH THREE TIMES DAILY FOR ANXIETY   irbesartan-hydrochlorothiazide (AVALIDE) 150-12.5 MG tablet TAKE 2 TABLETS BY MOUTH EVERY DAY   levonorgestrel (MIRENA) 20 MCG/DAY IUD 1 each by Intrauterine route once.   sertraline (ZOLOFT) 100 MG tablet Take 100 mg by mouth daily.   [DISCONTINUED] temazepam (RESTORIL) 15 MG capsule Take 1 capsule (15 mg total) by mouth at bedtime as needed for sleep.   [DISCONTINUED] traZODone (DESYREL) 50 MG tablet Take 1 tablet by mouth at bedtime as needed.       12/27/2022    8:29 AM 06/21/2022    8:06 AM 12/20/2021    9:23 AM 06/15/2021    8:08 AM  GAD 7 : Generalized Anxiety Score  Nervous, Anxious, on Edge 1 1 3 2   Control/stop worrying 1 0 1 2  Worry too much - different things 1 1 1 1   Trouble relaxing 1 0 0 1  Restless 1 1 1  0  Easily annoyed or irritable 1 0 0 0  Afraid - awful might happen 0 0 0 0  Total GAD 7 Score 6 3 6 6   Anxiety Difficulty Somewhat difficult Somewhat difficult Not difficult at all        12/27/2022    8:29 AM 06/21/2022    8:06 AM 12/20/2021    9:23 AM  Depression screen PHQ 2/9  Decreased Interest 1 1 0  Down, Depressed, Hopeless 0 0 1  PHQ - 2 Score 1 1 1   Altered sleeping 2 2 2   Tired, decreased energy 2 1 1   Change in appetite 0 0 1  Feeling bad or failure about yourself  0 0 0  Trouble concentrating 0 0 0  Moving slowly or fidgety/restless 1 0 0  Suicidal thoughts 0  0   PHQ-9 Score 6 4 5   Difficult doing work/chores Not difficult at all Somewhat difficult Somewhat difficult    BP Readings from Last 3 Encounters:  12/27/22 122/74  06/21/22 124/84  12/20/21 128/88    Physical Exam Vitals and nursing note reviewed.  Constitutional:      General: She is not in acute distress.    Appearance: She is well-developed.  HENT:     Head: Normocephalic and atraumatic.     Right Ear: Tympanic membrane and ear canal normal.     Left Ear: Tympanic membrane and ear canal normal.     Nose:     Right Sinus: No maxillary sinus tenderness.  Left Sinus: No maxillary sinus tenderness.  Eyes:     General: No scleral icterus.       Right eye: No discharge.        Left eye: No discharge.     Conjunctiva/sclera: Conjunctivae normal.  Neck:     Thyroid: No thyromegaly.     Vascular: No carotid bruit.  Cardiovascular:     Rate and Rhythm: Normal rate and regular rhythm.     Pulses: Normal pulses.     Heart sounds: Normal heart sounds.  Pulmonary:     Effort: Pulmonary effort is normal. No respiratory distress.     Breath sounds: No wheezing.  Chest:  Breasts:    Right: No mass, nipple discharge, skin change or tenderness.     Left: No mass, nipple discharge, skin change or tenderness.  Abdominal:     General: Bowel sounds are normal.     Palpations: Abdomen is soft.     Tenderness: There is no abdominal tenderness.  Musculoskeletal:     Cervical back: Normal range of motion. No erythema.     Right lower leg: No edema.     Left lower leg: No edema.  Lymphadenopathy:     Cervical: No cervical adenopathy.  Skin:    General: Skin is warm and dry.     Capillary Refill: Capillary refill takes less than 2 seconds.     Findings: No rash.  Neurological:     Mental Status: She is alert and oriented to person, place, and time.     Cranial Nerves: No cranial nerve deficit.     Sensory: No sensory deficit.     Deep Tendon Reflexes: Reflexes are normal and  symmetric.  Psychiatric:        Attention and Perception: Attention normal.        Mood and Affect: Mood normal.     Wt Readings from Last 3 Encounters:  12/27/22 238 lb (108 kg)  06/21/22 229 lb (103.9 kg)  12/20/21 239 lb (108.4 kg)    BP 122/74   Pulse 83   Ht 5\' 4"  (1.626 m)   Wt 238 lb (108 kg)   SpO2 98%   BMI 40.85 kg/m   Assessment and Plan:  Problem List Items Addressed This Visit       Unprioritized   Prediabetes    Managed with diet.  She has gained some weight recently and has been working on diet and exercise with good response. Will recheck labs and advise. Lab Results  Component Value Date   HGBA1C 6.6 (A) 06/21/2022         Relevant Orders   Hemoglobin A1c   Insomnia disorder (Chronic)    Did not tolerate Trazodone Would like to go back to PRN temazepam      Relevant Medications   temazepam (RESTORIL) 15 MG capsule   Essential hypertension (Chronic)    Normal exam with stable BP on amlodipine, hydrochlorothiazide and Avapro. No concerns or side effects to current medication. No change in regimen; continue low sodium diet.       Relevant Orders   CBC with Differential/Platelet   Comprehensive metabolic panel   TSH   POCT urinalysis dipstick (Completed)   Other Visit Diagnoses     Annual physical exam    -  Primary   Relevant Orders   CBC with Differential/Platelet   Comprehensive metabolic panel   Hemoglobin A1c   Lipid panel   TSH   Screening for lipid disorders  Relevant Orders   Lipid panel       No follow-ups on file.    Reubin Milan, MD [facility] [place]

## 2022-12-27 NOTE — Assessment & Plan Note (Signed)
Did not tolerate Trazodone Would like to go back to PRN temazepam

## 2022-12-27 NOTE — Assessment & Plan Note (Addendum)
Managed with diet.  She has gained some weight recently and has been working on diet and exercise with good response. Will recheck labs and advise. Lab Results  Component Value Date   HGBA1C 6.6 (A) 06/21/2022

## 2022-12-28 LAB — CBC WITH DIFFERENTIAL/PLATELET
Basophils Absolute: 0 10*3/uL (ref 0.0–0.2)
Basos: 1 %
EOS (ABSOLUTE): 0.1 10*3/uL (ref 0.0–0.4)
Eos: 1 %
Hematocrit: 38.6 % (ref 34.0–46.6)
Hemoglobin: 11.9 g/dL (ref 11.1–15.9)
Immature Grans (Abs): 0 10*3/uL (ref 0.0–0.1)
Immature Granulocytes: 1 %
Lymphocytes Absolute: 2.4 10*3/uL (ref 0.7–3.1)
Lymphs: 32 %
MCH: 26 pg — ABNORMAL LOW (ref 26.6–33.0)
MCHC: 30.8 g/dL — ABNORMAL LOW (ref 31.5–35.7)
MCV: 84 fL (ref 79–97)
Monocytes Absolute: 0.7 10*3/uL (ref 0.1–0.9)
Monocytes: 9 %
Neutrophils Absolute: 4.2 10*3/uL (ref 1.4–7.0)
Neutrophils: 56 %
Platelets: 400 10*3/uL (ref 150–450)
RBC: 4.58 x10E6/uL (ref 3.77–5.28)
RDW: 13.6 % (ref 11.7–15.4)
WBC: 7.4 10*3/uL (ref 3.4–10.8)

## 2022-12-28 LAB — COMPREHENSIVE METABOLIC PANEL
ALT: 19 IU/L (ref 0–32)
AST: 17 IU/L (ref 0–40)
Albumin: 4.1 g/dL (ref 3.9–4.9)
Alkaline Phosphatase: 89 IU/L (ref 44–121)
BUN/Creatinine Ratio: 12 (ref 9–23)
BUN: 9 mg/dL (ref 6–20)
Bilirubin Total: 0.3 mg/dL (ref 0.0–1.2)
CO2: 22 mmol/L (ref 20–29)
Calcium: 9.6 mg/dL (ref 8.7–10.2)
Chloride: 102 mmol/L (ref 96–106)
Creatinine, Ser: 0.76 mg/dL (ref 0.57–1.00)
Globulin, Total: 2.7 g/dL (ref 1.5–4.5)
Glucose: 81 mg/dL (ref 70–99)
Potassium: 4.4 mmol/L (ref 3.5–5.2)
Sodium: 138 mmol/L (ref 134–144)
Total Protein: 6.8 g/dL (ref 6.0–8.5)
eGFR: 105 mL/min/{1.73_m2} (ref >59)

## 2022-12-28 LAB — LIPID PANEL
Chol/HDL Ratio: 3.1 ratio (ref 0.0–4.4)
Cholesterol, Total: 113 mg/dL (ref 100–199)
HDL: 36 mg/dL — ABNORMAL LOW (ref >39)
LDL Chol Calc (NIH): 62 mg/dL (ref 0–99)
Triglycerides: 69 mg/dL (ref 0–149)
VLDL Cholesterol Cal: 15 mg/dL (ref 5–40)

## 2022-12-28 LAB — HEMOGLOBIN A1C
Est. average glucose Bld gHb Est-mCnc: 126 mg/dL
Hgb A1c MFr Bld: 6 % — ABNORMAL HIGH (ref 4.8–5.6)

## 2022-12-28 LAB — TSH: TSH: 1.79 u[IU]/mL (ref 0.450–4.500)

## 2023-01-17 ENCOUNTER — Telehealth: Payer: Self-pay | Admitting: Internal Medicine

## 2023-01-17 NOTE — Telephone Encounter (Signed)
Pt is calling in because irbesartan-hydrochlorothiazide (AVALIDE) 150-12.5 MG tablet [[id number]] was sent to the pharmacy. Pt says the pharmacy is trying to give her 10 pills which in return costs $150. Pt says she's used to paying $20 for 180 pills. Pt wants to know can the medication be sent elsewhere. Pt is requesting a call back.

## 2023-01-18 ENCOUNTER — Other Ambulatory Visit: Payer: Self-pay

## 2023-01-18 DIAGNOSIS — I10 Essential (primary) hypertension: Secondary | ICD-10-CM

## 2023-01-18 MED ORDER — IRBESARTAN-HYDROCHLOROTHIAZIDE 150-12.5 MG PO TABS: 2.0000 | ORAL_TABLET | Freq: Every day | ORAL | 0 refills | Status: AC

## 2023-01-18 NOTE — Telephone Encounter (Signed)
Medication sent.  - Michella Detjen

## 2023-01-18 NOTE — Telephone Encounter (Signed)
Pt. Called back. Would like [facility] called to another [facility] - [address] [place] [phone] Phone - [phone]. Reports the pharmacies in her area are in low supply. Please advise pt.

## 2023-01-18 NOTE — Telephone Encounter (Signed)
Called and left VM informing patient we sent this in for 180 tablets a month ago, so unsure why the pharmacy is only giving her 10 tablets. Also, asked her to give me the pharmacy she wants this sent to when she calls back.  - Brandi Cole

## 2023-03-29 DIAGNOSIS — Z6841 Body Mass Index (BMI) 40.0 and over, adult: Secondary | ICD-10-CM | POA: Diagnosis not present

## 2023-03-29 DIAGNOSIS — M25512 Pain in left shoulder: Secondary | ICD-10-CM | POA: Diagnosis not present

## 2023-03-29 DIAGNOSIS — F419 Anxiety disorder, unspecified: Secondary | ICD-10-CM | POA: Diagnosis not present

## 2023-03-29 DIAGNOSIS — F321 Major depressive disorder, single episode, moderate: Secondary | ICD-10-CM | POA: Diagnosis not present

## 2023-03-29 DIAGNOSIS — I10 Essential (primary) hypertension: Secondary | ICD-10-CM | POA: Diagnosis not present

## 2023-03-29 DIAGNOSIS — F349 Persistent mood [affective] disorder, unspecified: Secondary | ICD-10-CM | POA: Diagnosis not present

## 2023-03-29 DIAGNOSIS — F320 Major depressive disorder, single episode, mild: Secondary | ICD-10-CM | POA: Diagnosis not present

## 2023-04-19 DIAGNOSIS — F321 Major depressive disorder, single episode, moderate: Secondary | ICD-10-CM | POA: Diagnosis not present

## 2023-04-19 DIAGNOSIS — F349 Persistent mood [affective] disorder, unspecified: Secondary | ICD-10-CM | POA: Diagnosis not present

## 2023-04-19 DIAGNOSIS — F419 Anxiety disorder, unspecified: Secondary | ICD-10-CM | POA: Diagnosis not present

## 2023-04-25 DIAGNOSIS — M7542 Impingement syndrome of left shoulder: Secondary | ICD-10-CM | POA: Diagnosis not present

## 2023-04-25 DIAGNOSIS — F330 Major depressive disorder, recurrent, mild: Secondary | ICD-10-CM | POA: Diagnosis not present

## 2023-04-26 DIAGNOSIS — L709 Acne, unspecified: Secondary | ICD-10-CM | POA: Diagnosis not present

## 2023-04-26 DIAGNOSIS — R102 Pelvic and perineal pain: Secondary | ICD-10-CM | POA: Diagnosis not present

## 2023-04-26 DIAGNOSIS — N898 Other specified noninflammatory disorders of vagina: Secondary | ICD-10-CM | POA: Diagnosis not present

## 2023-04-26 DIAGNOSIS — L680 Hirsutism: Secondary | ICD-10-CM | POA: Diagnosis not present

## 2023-04-26 DIAGNOSIS — M7542 Impingement syndrome of left shoulder: Secondary | ICD-10-CM | POA: Diagnosis not present

## 2023-05-02 DIAGNOSIS — M7542 Impingement syndrome of left shoulder: Secondary | ICD-10-CM | POA: Diagnosis not present

## 2023-05-02 DIAGNOSIS — I10 Essential (primary) hypertension: Secondary | ICD-10-CM | POA: Diagnosis not present

## 2023-05-02 DIAGNOSIS — Z6841 Body Mass Index (BMI) 40.0 and over, adult: Secondary | ICD-10-CM | POA: Diagnosis not present

## 2023-05-03 DIAGNOSIS — M7542 Impingement syndrome of left shoulder: Secondary | ICD-10-CM | POA: Diagnosis not present

## 2023-05-09 DIAGNOSIS — F330 Major depressive disorder, recurrent, mild: Secondary | ICD-10-CM | POA: Diagnosis not present

## 2023-05-17 DIAGNOSIS — Z30432 Encounter for removal of intrauterine contraceptive device: Secondary | ICD-10-CM | POA: Diagnosis not present

## 2023-05-21 DIAGNOSIS — F330 Major depressive disorder, recurrent, mild: Secondary | ICD-10-CM | POA: Diagnosis not present

## 2023-05-24 DIAGNOSIS — F321 Major depressive disorder, single episode, moderate: Secondary | ICD-10-CM | POA: Diagnosis not present

## 2023-05-24 DIAGNOSIS — F349 Persistent mood [affective] disorder, unspecified: Secondary | ICD-10-CM | POA: Diagnosis not present

## 2023-05-24 DIAGNOSIS — F419 Anxiety disorder, unspecified: Secondary | ICD-10-CM | POA: Diagnosis not present

## 2023-06-06 DIAGNOSIS — Z30431 Encounter for routine checking of intrauterine contraceptive device: Secondary | ICD-10-CM | POA: Diagnosis not present

## 2023-06-06 DIAGNOSIS — N939 Abnormal uterine and vaginal bleeding, unspecified: Secondary | ICD-10-CM | POA: Diagnosis not present

## 2023-06-15 DIAGNOSIS — F330 Major depressive disorder, recurrent, mild: Secondary | ICD-10-CM | POA: Diagnosis not present

## 2023-06-21 DIAGNOSIS — D259 Leiomyoma of uterus, unspecified: Secondary | ICD-10-CM | POA: Diagnosis not present

## 2023-06-21 DIAGNOSIS — N944 Primary dysmenorrhea: Secondary | ICD-10-CM | POA: Diagnosis not present

## 2023-06-21 DIAGNOSIS — N939 Abnormal uterine and vaginal bleeding, unspecified: Secondary | ICD-10-CM | POA: Diagnosis not present

## 2023-06-28 DIAGNOSIS — F319 Bipolar disorder, unspecified: Secondary | ICD-10-CM | POA: Diagnosis not present

## 2023-06-28 DIAGNOSIS — F321 Major depressive disorder, single episode, moderate: Secondary | ICD-10-CM | POA: Diagnosis not present

## 2023-06-28 DIAGNOSIS — F349 Persistent mood [affective] disorder, unspecified: Secondary | ICD-10-CM | POA: Diagnosis not present

## 2023-07-06 DIAGNOSIS — F330 Major depressive disorder, recurrent, mild: Secondary | ICD-10-CM | POA: Diagnosis not present

## 2023-07-25 DIAGNOSIS — Z113 Encounter for screening for infections with a predominantly sexual mode of transmission: Secondary | ICD-10-CM | POA: Diagnosis not present

## 2023-07-25 DIAGNOSIS — R1013 Epigastric pain: Secondary | ICD-10-CM | POA: Diagnosis not present

## 2023-07-25 DIAGNOSIS — Z01419 Encounter for gynecological examination (general) (routine) without abnormal findings: Secondary | ICD-10-CM | POA: Diagnosis not present

## 2023-07-25 DIAGNOSIS — R0789 Other chest pain: Secondary | ICD-10-CM | POA: Diagnosis not present

## 2023-08-09 DIAGNOSIS — F349 Persistent mood [affective] disorder, unspecified: Secondary | ICD-10-CM | POA: Diagnosis not present

## 2023-08-09 DIAGNOSIS — F419 Anxiety disorder, unspecified: Secondary | ICD-10-CM | POA: Diagnosis not present

## 2023-08-09 DIAGNOSIS — F321 Major depressive disorder, single episode, moderate: Secondary | ICD-10-CM | POA: Diagnosis not present

## 2023-09-06 DIAGNOSIS — F419 Anxiety disorder, unspecified: Secondary | ICD-10-CM | POA: Diagnosis not present

## 2023-09-06 DIAGNOSIS — F349 Persistent mood [affective] disorder, unspecified: Secondary | ICD-10-CM | POA: Diagnosis not present

## 2023-09-06 DIAGNOSIS — F321 Major depressive disorder, single episode, moderate: Secondary | ICD-10-CM | POA: Diagnosis not present

## 2023-09-20 DIAGNOSIS — D259 Leiomyoma of uterus, unspecified: Secondary | ICD-10-CM | POA: Diagnosis not present

## 2023-10-04 DIAGNOSIS — Z01812 Encounter for preprocedural laboratory examination: Secondary | ICD-10-CM | POA: Diagnosis not present

## 2023-10-04 DIAGNOSIS — N939 Abnormal uterine and vaginal bleeding, unspecified: Secondary | ICD-10-CM | POA: Diagnosis not present

## 2023-10-22 DIAGNOSIS — N939 Abnormal uterine and vaginal bleeding, unspecified: Secondary | ICD-10-CM | POA: Diagnosis not present

## 2023-10-22 DIAGNOSIS — D259 Leiomyoma of uterus, unspecified: Secondary | ICD-10-CM | POA: Diagnosis not present

## 2023-10-22 DIAGNOSIS — N858 Other specified noninflammatory disorders of uterus: Secondary | ICD-10-CM | POA: Diagnosis not present

## 2023-11-06 DIAGNOSIS — F419 Anxiety disorder, unspecified: Secondary | ICD-10-CM | POA: Diagnosis not present

## 2023-11-06 DIAGNOSIS — F321 Major depressive disorder, single episode, moderate: Secondary | ICD-10-CM | POA: Diagnosis not present

## 2023-11-06 DIAGNOSIS — F349 Persistent mood [affective] disorder, unspecified: Secondary | ICD-10-CM | POA: Diagnosis not present

## 2023-12-25 DIAGNOSIS — F321 Major depressive disorder, single episode, moderate: Secondary | ICD-10-CM | POA: Diagnosis not present

## 2023-12-25 DIAGNOSIS — F419 Anxiety disorder, unspecified: Secondary | ICD-10-CM | POA: Diagnosis not present

## 2023-12-25 DIAGNOSIS — F349 Persistent mood [affective] disorder, unspecified: Secondary | ICD-10-CM | POA: Diagnosis not present

## 2024-01-09 DIAGNOSIS — D251 Intramural leiomyoma of uterus: Secondary | ICD-10-CM | POA: Diagnosis not present

## 2024-01-09 DIAGNOSIS — N939 Abnormal uterine and vaginal bleeding, unspecified: Secondary | ICD-10-CM | POA: Diagnosis not present

## 2024-01-22 DIAGNOSIS — F349 Persistent mood [affective] disorder, unspecified: Secondary | ICD-10-CM | POA: Diagnosis not present

## 2024-01-22 DIAGNOSIS — F419 Anxiety disorder, unspecified: Secondary | ICD-10-CM | POA: Diagnosis not present

## 2024-01-22 DIAGNOSIS — F321 Major depressive disorder, single episode, moderate: Secondary | ICD-10-CM | POA: Diagnosis not present

## 2024-02-01 DIAGNOSIS — L299 Pruritus, unspecified: Secondary | ICD-10-CM | POA: Diagnosis not present

## 2024-02-01 DIAGNOSIS — R21 Rash and other nonspecific skin eruption: Secondary | ICD-10-CM | POA: Diagnosis not present

## 2024-02-21 DIAGNOSIS — F321 Major depressive disorder, single episode, moderate: Secondary | ICD-10-CM | POA: Diagnosis not present

## 2024-02-21 DIAGNOSIS — F419 Anxiety disorder, unspecified: Secondary | ICD-10-CM | POA: Diagnosis not present

## 2024-02-21 DIAGNOSIS — R300 Dysuria: Secondary | ICD-10-CM | POA: Diagnosis not present

## 2024-02-21 DIAGNOSIS — M791 Myalgia, unspecified site: Secondary | ICD-10-CM | POA: Diagnosis not present

## 2024-02-21 DIAGNOSIS — M545 Low back pain, unspecified: Secondary | ICD-10-CM | POA: Diagnosis not present

## 2024-04-02 DIAGNOSIS — D251 Intramural leiomyoma of uterus: Secondary | ICD-10-CM | POA: Diagnosis not present

## 2024-04-02 DIAGNOSIS — Z113 Encounter for screening for infections with a predominantly sexual mode of transmission: Secondary | ICD-10-CM | POA: Diagnosis not present

## 2024-04-02 DIAGNOSIS — N939 Abnormal uterine and vaginal bleeding, unspecified: Secondary | ICD-10-CM | POA: Diagnosis not present
